# Patient Record
Sex: Female | Born: 1990 | Race: White | Hispanic: No | Marital: Married | Smoking: Never smoker
Health system: Southern US, Community
[De-identification: ages and names within clinical notes are randomized; demographics above are authoritative.]

## PROBLEM LIST (undated history)

## (undated) ENCOUNTER — Inpatient Hospital Stay (HOSPITAL_COMMUNITY): Payer: Self-pay

## (undated) ENCOUNTER — Ambulatory Visit: Admission: EM | Discharge status: Absent without leave | Payer: Self-pay

## (undated) DIAGNOSIS — I10 Essential (primary) hypertension: Secondary | ICD-10-CM

## (undated) DIAGNOSIS — M549 Dorsalgia, unspecified: Secondary | ICD-10-CM

## (undated) DIAGNOSIS — R569 Unspecified convulsions: Secondary | ICD-10-CM

## (undated) DIAGNOSIS — O159 Eclampsia, unspecified as to time period: Secondary | ICD-10-CM

## (undated) DIAGNOSIS — R87629 Unspecified abnormal cytological findings in specimens from vagina: Secondary | ICD-10-CM

## (undated) DIAGNOSIS — R51 Headache: Secondary | ICD-10-CM

## (undated) DIAGNOSIS — E119 Type 2 diabetes mellitus without complications: Secondary | ICD-10-CM

## (undated) HISTORY — DX: Essential (primary) hypertension: I10

## (undated) HISTORY — DX: Unspecified abnormal cytological findings in specimens from vagina: R87.629

## (undated) HISTORY — DX: Type 2 diabetes mellitus without complications: E11.9

---
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
## 2012-03-05 LAB — OB RESULTS CONSOLE RPR: RPR: NONREACTIVE

## 2012-03-05 LAB — OB RESULTS CONSOLE HIV ANTIBODY (ROUTINE TESTING): HIV: NONREACTIVE

## 2012-08-07 ENCOUNTER — Inpatient Hospital Stay (HOSPITAL_COMMUNITY): Payer: Medicaid Other

## 2012-08-07 ENCOUNTER — Encounter (HOSPITAL_COMMUNITY): Payer: Self-pay

## 2012-08-07 ENCOUNTER — Inpatient Hospital Stay (HOSPITAL_COMMUNITY)
Admission: AD | Admit: 2012-08-07 | Discharge: 2012-08-09 | DRG: 781 | Disposition: A | Discharge status: Home / Self Care | Payer: Medicaid Other | Source: Ambulatory Visit | Attending: Obstetrics and Gynecology | Admitting: Obstetrics and Gynecology

## 2012-08-07 DIAGNOSIS — O1490 Unspecified pre-eclampsia, unspecified trimester: Secondary | ICD-10-CM

## 2012-08-07 DIAGNOSIS — R079 Chest pain, unspecified: Secondary | ICD-10-CM | POA: Diagnosis present

## 2012-08-07 DIAGNOSIS — O133 Gestational [pregnancy-induced] hypertension without significant proteinuria, third trimester: Secondary | ICD-10-CM

## 2012-08-07 DIAGNOSIS — O99891 Other specified diseases and conditions complicating pregnancy: Secondary | ICD-10-CM | POA: Diagnosis present

## 2012-08-07 DIAGNOSIS — O139 Gestational [pregnancy-induced] hypertension without significant proteinuria, unspecified trimester: Principal | ICD-10-CM | POA: Diagnosis present

## 2012-08-07 DIAGNOSIS — R51 Headache: Secondary | ICD-10-CM | POA: Diagnosis present

## 2012-08-07 DIAGNOSIS — R0602 Shortness of breath: Secondary | ICD-10-CM | POA: Diagnosis present

## 2012-08-07 HISTORY — DX: Headache: R51

## 2012-08-07 LAB — COMPREHENSIVE METABOLIC PANEL
AST: 13 U/L (ref 0–37)
BUN: 8 mg/dL (ref 6–23)
Calcium: 9.1 mg/dL (ref 8.4–10.5)
Sodium: 135 mEq/L (ref 135–145)
Total Bilirubin: 0.2 mg/dL — ABNORMAL LOW (ref 0.3–1.2)
Total Protein: 5.8 g/dL — ABNORMAL LOW (ref 6.0–8.3)

## 2012-08-07 LAB — URINALYSIS, ROUTINE W REFLEX MICROSCOPIC
Bilirubin Urine: NEGATIVE
Hgb urine dipstick: NEGATIVE
Ketones, ur: NEGATIVE mg/dL
Leukocytes, UA: NEGATIVE
Specific Gravity, Urine: >1.03 — ABNORMAL HIGH (ref 1.005–1.030)
pH: 6 (ref 5.0–8.0)

## 2012-08-07 LAB — CBC
MCH: 31.3 pg (ref 26.0–34.0)
MCV: 92 fL (ref 78.0–100.0)
Platelets: 143 10*3/uL — ABNORMAL LOW (ref 150–400)
RBC: 3.61 MIL/uL — ABNORMAL LOW (ref 3.87–5.11)
RDW: 12.5 % (ref 11.5–15.5)

## 2012-08-07 LAB — TYPE AND SCREEN
ABO/RH(D): A POS
Antibody Screen: NEGATIVE

## 2012-08-07 LAB — ABO/RH: ABO/RH(D): A POS

## 2012-08-07 LAB — URINE MICROSCOPIC-ADD ON

## 2012-08-07 MED ORDER — DOCUSATE SODIUM 100 MG PO CAPS
100.0000 mg | ORAL_CAPSULE | Freq: Every day | ORAL | Status: DC
  Administered 2012-08-07 – 2012-08-09 (×2): 100 mg via ORAL
  Filled 2012-08-07 (×2): qty 1

## 2012-08-07 MED ORDER — ZOLPIDEM TARTRATE 5 MG PO TABS: 5.0000 mg | ORAL_TABLET | Freq: Every evening | ORAL | Status: DC | PRN

## 2012-08-07 MED ORDER — ACETAMINOPHEN 325 MG PO TABS: 650.0000 mg | ORAL_TABLET | ORAL | Status: DC | PRN

## 2012-08-07 MED ORDER — BETAMETHASONE SOD PHOS & ACET 6 (3-3) MG/ML IJ SUSP
12.0000 mg | INTRAMUSCULAR | Status: AC
  Administered 2012-08-07 – 2012-08-08 (×2): 12 mg via INTRAMUSCULAR
  Filled 2012-08-07 (×2): qty 2

## 2012-08-07 MED ORDER — CALCIUM CARBONATE ANTACID 500 MG PO CHEW
2.0000 | CHEWABLE_TABLET | ORAL | Status: DC | PRN
  Administered 2012-08-09: 400 mg via ORAL
  Filled 2012-08-07: qty 2

## 2012-08-07 MED ORDER — LACTATED RINGERS IV SOLN
INTRAVENOUS | Status: DC
  Administered 2012-08-07 – 2012-08-09 (×4): via INTRAVENOUS

## 2012-08-07 MED ORDER — PANTOPRAZOLE SODIUM 40 MG IV SOLR
40.0000 mg | Freq: Once | INTRAVENOUS | Status: AC
  Administered 2012-08-07: 40 mg via INTRAVENOUS
  Filled 2012-08-07: qty 40

## 2012-08-07 NOTE — H&P (Signed)
Christine Woodard is a 21 y.o. female presenting for elevated Blood Pressure  The patient was seen in the office today for her routine prenatal appointment.  She was c/o headaches for the past several weeks.  In the office she had a blood pressure of 120/80 and 1+ proteinuria.  She was sent to MAU for serial BPs and labs.  In MAU her BPs were 150s/90-100.  She did have 2 BPs that were 180/110, however she was found to be laying on the BP cuff when this occurred.  Pre-eclampsia labs were drawn and WNL. U/S for EFW Due to S>D.  EFW was ~37 weeks with an AFI 23. BPP 8/8. Given the elevated BPs the patient is admitted for BMZ, 24 hr urine and serial BPs.  The patients case was discussed with Dr. Sherrie George.  If she does begin to have sustained BPs in the severe range then she endorsed induction of labor History OB History    Grav Para Term Preterm Abortions TAB SAB Ect Mult Living   1 0 0 0 0 0 0 0 0 0      Past Medical History  Diagnosis Date  . Headache    History reviewed. No pertinent past surgical history. Family History: family history is not on file. Social History:  reports that she has never smoked. She does not have any smokeless tobacco history on file. She reports that she does not drink alcohol or use illicit drugs.   Prenatal Transfer Tool  Maternal Diabetes: No, elevated 1hr gtt, 3hr WNL Genetic Screening: Quad WNL Maternal Ultrasounds/Referrals: Normal Fetal Ultrasounds or other Referrals:  No Maternal Substance Abuse:  No Significant Maternal Medications:  No Significant Maternal Lab Results:  No Other Comments:  No  ROS: As above  Dilation: 1 Effacement (%): 70 Station: -2 Exam by:: dr Tenny Craw Blood pressure 153/94, pulse 66, temperature 98.6 F (37 C), temperature source Oral, resp. rate 20, height 5\' 6"  (1.676 m), weight 98.884 kg (218 lb), last menstrual period 09/04/2011. Exam Physical Exam  Prenatal labs: ABO, Rh: --/--/A POS (11/26 1614) Antibody: NEG (11/26  1614) Rubella:  Immune RPR:   NR HBsAg:   Neg HIV:   NR GBS:   Pending  Assessment/Plan: 1) Admit 2) Serial BPs, 24 hr urine, BMZ for enhancement of FLM    Kenslie Abbruzzese H. 08/07/2012, 5:48 PM

## 2012-08-07 NOTE — MAU Provider Note (Signed)
Chief Complaint:  Hypertension and Headache   First provider contact initiated 08/07/12 at 1246.  HPI: Christine Woodard is a 21 y.o. G1P0000 at [redacted]w[redacted]d who was sent to maternity admissions from office for Pre-eclampsia work-up. Reports HA and sternal pain x 2 weeks, none now. HA only occurs simultaneously with sternal pain. No relationship between pain and exertion. Also reports increase in pedal edema. Denies contractions, leakage of fluid, vaginal bleeding, vision changes, shortness of breath, cough. Good fetal movement.   Past Medical History: Past Medical History  Diagnosis Date  . Headache     Past obstetric history: OB History    Grav Para Term Preterm Abortions TAB SAB Ect Mult Living   1 0 0 0 0 0 0 0 0 0      # Outc Date GA Lbr Len/2nd Wgt Sex Del Anes PTL Lv   1 CUR               Past Surgical History: History reviewed. No pertinent past surgical history.  Family History: History reviewed. No pertinent family history.  Social History: History  Substance Use Topics  . Smoking status: Never Smoker   . Smokeless tobacco: Not on file  . Alcohol Use: No    Allergies: No Known Allergies  Meds:  Prescriptions prior to admission  Medication Sig Dispense Refill  . flintstones complete (FLINTSTONES) 60 MG chewable tablet Chew 1 tablet by mouth 2 (two) times daily.        ROS: Pertinent findings in history of present illness.  Physical Exam  Blood pressure 153/94, pulse 66, temperature 98.6 F (37 C), temperature source Oral, resp. rate 20, height 5\' 6"  (1.676 m), weight 98.884 kg (218 lb), last menstrual period 09/04/2011. GENERAL: Well-developed, well-nourished female in no acute distress.  HEENT: normocephalic HEART: normal rate RESP: normal effort ABDOMEN: Soft, non-tender, gravid appropriate for gestational age EXTREMITIES: Nontender, no edema NEURO: alert and oriented, deep tendon reflexes 3+, one beat clonus bilaterally SPECULUM EXAM: Deferred  FHT:   Baseline 120 , moderate variability, accelerations present, no decelerations Contractions: rare, mild   Labs: Results for orders placed during the hospital encounter of 08/07/12 (from the past 24 hour(s))  URINALYSIS, ROUTINE W REFLEX MICROSCOPIC     Status: Abnormal   Collection Time   08/07/12 12:10 PM      Component Value Range   Color, Urine YELLOW  YELLOW   APPearance HAZY (*) CLEAR   Specific Gravity, Urine >1.030 (*) 1.005 - 1.030   pH 6.0  5.0 - 8.0   Glucose, UA NEGATIVE  NEGATIVE mg/dL   Hgb urine dipstick NEGATIVE  NEGATIVE   Bilirubin Urine NEGATIVE  NEGATIVE   Ketones, ur NEGATIVE  NEGATIVE mg/dL   Protein, ur 161 (*) NEGATIVE mg/dL   Urobilinogen, UA 0.2  0.0 - 1.0 mg/dL   Nitrite NEGATIVE  NEGATIVE   Leukocytes, UA NEGATIVE  NEGATIVE  CBC     Status: Abnormal   Collection Time   08/07/12 12:10 PM      Component Value Range   WBC 10.0  4.0 - 10.5 K/uL   RBC 3.61 (*) 3.87 - 5.11 MIL/uL   Hemoglobin 11.3 (*) 12.0 - 15.0 g/dL   HCT 09.6 (*) 04.5 - 40.9 %   MCV 92.0  78.0 - 100.0 fL   MCH 31.3  26.0 - 34.0 pg   MCHC 34.0  30.0 - 36.0 g/dL   RDW 81.1  91.4 - 78.2 %   Platelets 143 (*) 150 -  400 K/uL  COMPREHENSIVE METABOLIC PANEL     Status: Abnormal   Collection Time   08/07/12 12:10 PM      Component Value Range   Sodium 135  135 - 145 mEq/L   Potassium 4.0  3.5 - 5.1 mEq/L   Chloride 103  96 - 112 mEq/L   CO2 22  19 - 32 mEq/L   Glucose, Bld 78  70 - 99 mg/dL   BUN 8  6 - 23 mg/dL   Creatinine, Ser 0.86  0.50 - 1.10 mg/dL   Calcium 9.1  8.4 - 57.8 mg/dL   Total Protein 5.8 (*) 6.0 - 8.3 g/dL   Albumin 2.6 (*) 3.5 - 5.2 g/dL   AST 13  0 - 37 U/L   ALT 6  0 - 35 U/L   Alkaline Phosphatase 113  39 - 117 U/L   Total Bilirubin 0.2 (*) 0.3 - 1.2 mg/dL   GFR calc non Af Amer >90  >90 mL/min   GFR calc Af Amer >90  >90 mL/min  LACTATE DEHYDROGENASE     Status: Abnormal   Collection Time   08/07/12 12:10 PM      Component Value Range   LDH 254 (*) 94 - 250  U/L  URIC ACID     Status: Normal   Collection Time   08/07/12 12:10 PM      Component Value Range   Uric Acid, Serum 4.3  2.4 - 7.0 mg/dL  URINE MICROSCOPIC-ADD ON     Status: Abnormal   Collection Time   08/07/12 12:10 PM      Component Value Range   Squamous Epithelial / LPF MANY (*) RARE   WBC, UA 3-6  <3 WBC/hpf   RBC / HPF 0-2  <3 RBC/hpf   Bacteria, UA MANY (*) RARE   Crystals CA OXALATE CRYSTALS (*) NEGATIVE   Urine-Other MUCOUS PRESENT    TYPE AND SCREEN     Status: Normal   Collection Time   08/07/12  4:14 PM      Component Value Range   ABO/RH(D) A POS     Antibody Screen NEG     Sample Expiration 08/10/2012    ABO/RH     Status: Normal   Collection Time   08/07/12  4:14 PM      Component Value Range   ABO/RH(D) A POS      Imaging:  [place]: EFW 2792 gm/6 lb 2 oz (88%) AFI 23.41 (89%) BPP 8/8  MAU Course:   Assessment: 1. Proteinuric hypertension of pregnancy     Plan: Admit for 24 hour urine, BMZ and BP monitoring per Dr. Tenny Craw.   [place], CNM 08/07/2012 7:11 PM

## 2012-08-07 NOTE — MAU Note (Signed)
Patient is sent from the office due to elevated BP and headache. She denies visual disturbance. Reports good fetal movement.

## 2012-08-08 ENCOUNTER — Inpatient Hospital Stay (HOSPITAL_COMMUNITY): Payer: Medicaid Other

## 2012-08-08 LAB — PROTEIN, URINE, 24 HOUR
Protein, 24H Urine: 285 mg/d — ABNORMAL HIGH (ref 50–100)
Protein, Urine: 57 mg/dL
Urine Total Volume-UPROT: 500 mL

## 2012-08-08 LAB — CREATININE CLEARANCE, URINE, 24 HOUR
Creatinine: 0.51 mg/dL (ref 0.50–1.10)
Urine Total Volume-CRCL: 500 mL

## 2012-08-08 LAB — URINE CULTURE: Colony Count: 2000

## 2012-08-08 MED ORDER — FAMOTIDINE 20 MG PO TABS: 20.0000 mg | ORAL_TABLET | Freq: Two times a day (BID) | ORAL | Status: DC

## 2012-08-08 NOTE — Progress Notes (Addendum)
21 y.o. G1P0000 [redacted]w[redacted]d HD#1 admitted for PIH.  Pt currently stable with no c/o - SOB/CP is NOT better despite SaO2 are 98% on RA.    Chest pain is across top of lungs both sides.  SOB worsens with chest pain.   HA come with chest pain as well.  Good FM.  Filed Vitals:   08/07/12 2030 08/07/12 2100 08/07/12 2300 08/08/12 0329  BP:    128/73  Pulse:   79 72  Temp:    98.5 F (36.9 C)  TempSrc:    Oral  Resp: 18 18 18 16   Height:      Weight:      SpO2:   98%    Lungs CTA Cor RRR Abd  Soft, gravid, nontender Ex SCDs FHTs  120s, good short term variability, NST R Toco  occ  Results for orders placed during the hospital encounter of 08/07/12 (from the past 24 hour(s))  URINALYSIS, ROUTINE W REFLEX MICROSCOPIC     Status: Abnormal   Collection Time   08/07/12 12:10 PM      Component Value Range   Color, Urine YELLOW  YELLOW   APPearance HAZY (*) CLEAR   Specific Gravity, Urine >1.030 (*) 1.005 - 1.030   pH 6.0  5.0 - 8.0   Glucose, UA NEGATIVE  NEGATIVE mg/dL   Hgb urine dipstick NEGATIVE  NEGATIVE   Bilirubin Urine NEGATIVE  NEGATIVE   Ketones, ur NEGATIVE  NEGATIVE mg/dL   Protein, ur 664 (*) NEGATIVE mg/dL   Urobilinogen, UA 0.2  0.0 - 1.0 mg/dL   Nitrite NEGATIVE  NEGATIVE   Leukocytes, UA NEGATIVE  NEGATIVE  CBC     Status: Abnormal   Collection Time   08/07/12 12:10 PM      Component Value Range   WBC 10.0  4.0 - 10.5 K/uL   RBC 3.61 (*) 3.87 - 5.11 MIL/uL   Hemoglobin 11.3 (*) 12.0 - 15.0 g/dL   HCT 40.3 (*) 47.4 - 25.9 %   MCV 92.0  78.0 - 100.0 fL   MCH 31.3  26.0 - 34.0 pg   MCHC 34.0  30.0 - 36.0 g/dL   RDW 56.3  87.5 - 64.3 %   Platelets 143 (*) 150 - 400 K/uL  COMPREHENSIVE METABOLIC PANEL     Status: Abnormal   Collection Time   08/07/12 12:10 PM      Component Value Range   Sodium 135  135 - 145 mEq/L   Potassium 4.0  3.5 - 5.1 mEq/L   Chloride 103  96 - 112 mEq/L   CO2 22  19 - 32 mEq/L   Glucose, Bld 78  70 - 99 mg/dL   BUN 8  6 - 23 mg/dL   Creatinine, Ser 3.29  0.50 - 1.10 mg/dL   Calcium 9.1  8.4 - 51.8 mg/dL   Total Protein 5.8 (*) 6.0 - 8.3 g/dL   Albumin 2.6 (*) 3.5 - 5.2 g/dL   AST 13  0 - 37 U/L   ALT 6  0 - 35 U/L   Alkaline Phosphatase 113  39 - 117 U/L   Total Bilirubin 0.2 (*) 0.3 - 1.2 mg/dL   GFR calc non Af Amer >90  >90 mL/min   GFR calc Af Amer >90  >90 mL/min  LACTATE DEHYDROGENASE     Status: Abnormal   Collection Time   08/07/12 12:10 PM      Component Value Range   LDH 254 (*) 94 -  250 U/L  URIC ACID     Status: Normal   Collection Time   08/07/12 12:10 PM      Component Value Range   Uric Acid, Serum 4.3  2.4 - 7.0 mg/dL  URINE MICROSCOPIC-ADD ON     Status: Abnormal   Collection Time   08/07/12 12:10 PM      Component Value Range   Squamous Epithelial / LPF MANY (*) RARE   WBC, UA 3-6  <3 WBC/hpf   RBC / HPF 0-2  <3 RBC/hpf   Bacteria, UA MANY (*) RARE   Crystals CA OXALATE CRYSTALS (*) NEGATIVE   Urine-Other MUCOUS PRESENT    TYPE AND SCREEN     Status: Normal   Collection Time   08/07/12  4:14 PM      Component Value Range   ABO/RH(D) A POS     Antibody Screen NEG     Sample Expiration 08/10/2012    ABO/RH     Status: Normal   Collection Time   08/07/12  4:14 PM      Component Value Range   ABO/RH(D) A POS     U/S  Vtx, EFW 6#2, AFI 23, BPP 8/8 yesterday.  A:  HD#1  [redacted]w[redacted]d with Mild Preeclampsia.  BPs were initially 150s/100s but now are in normal range.  She had 3+ protein on dip.  P: 1.  Pt was 33 6/7 weeks yesterday and received BMZ.  Second dose due today.   2.  24 hour urine is in progress.  3.  Repeat labs in am unless 24 hour urine is >3 gm protein. 4.  Although it is likely that chest pain/SOB is related to anxiety, pt continues to c/o about it and I feel we need to at least do a CXR with shielding.  Gilmar Bua A

## 2012-08-08 NOTE — Progress Notes (Signed)
08/08/12 1300  Clinical Encounter Type  Visited With Patient and family together  Visit Type Initial;Spiritual support;Social support  Spiritual Encounters  Spiritual Needs Emotional    Made initial visit to introduce chaplain services and availability.  Christine Woodard was calm and hopeful that lab results will allow her to go home, but also appreciative to know of extra support if desired during this or a subsequent stay.  Provided pastoral presence and encouragement.  [address] [place], [place] [phone]

## 2012-08-09 MED ORDER — ZOLPIDEM TARTRATE 5 MG PO TABS: 5.0000 mg | ORAL_TABLET | Freq: Every evening | ORAL | Status: DC | PRN

## 2012-08-09 MED ORDER — DOCUSATE SODIUM 100 MG PO CAPS: 100.0000 mg | ORAL_CAPSULE | Freq: Two times a day (BID) | ORAL | Status: DC

## 2012-08-09 MED ORDER — OMEPRAZOLE 40 MG PO CPDR: 40.0000 mg | DELAYED_RELEASE_CAPSULE | Freq: Every day | ORAL | Status: DC

## 2012-08-09 NOTE — Progress Notes (Signed)
Discharged home via wheelchair.

## 2012-08-09 NOTE — Discharge Summary (Signed)
Obstetric Discharge Summary Reason for Admission: Elevated Blood Pressures Prenatal Procedures: NST, ultrasound and 24 hr urine for protein, Betamethasone x 2 11/26 & 11/27 Intrapartum Procedures: NA Postpartum Procedures: NA Complications-Operative and Postpartum: NA Hemoglobin  Date Value Range Status  08/07/2012 11.3* 12.0 - 15.0 g/dL Final     HCT  Date Value Range Status  08/07/2012 33.2* 36.0 - 46.0 % Final    Physical Exam:  General: alert, cooperative and appears stated age Gravid soft NT FHT Reactive NST  Discharge Diagnoses: Preterm pregnancy, gestational hypertension. 24hr urine protein 285 mg  Discharge Information: Date: 08/09/2012 Activity: bedrest Diet: routine, increase fluid intake with water Medications: Ambien, Prilosec, Colace Condition: stable Instructions: refer to practice specific booklet and F/U in office Tuesday for a BP check Discharge to: home Follow-up Information    Follow up with Almon Hercules., MD. In 5 days. (Call office monday for an appointment Tuesday )    Contact information:   [address] [place] [address] [phone]          Almon Hercules. 08/09/2012, 12:17 PM

## 2012-08-09 NOTE — Progress Notes (Signed)
Pt states my chest hurts. Feels like my food is trying to come up. Assessed further. Tums given po. Informed pt on heartburn/indigestion. Receptive.

## 2012-08-10 ENCOUNTER — Encounter (HOSPITAL_COMMUNITY): Payer: Self-pay

## 2012-08-10 ENCOUNTER — Inpatient Hospital Stay (HOSPITAL_COMMUNITY)
Admission: AD | Admit: 2012-08-10 | Discharge: 2012-08-13 | DRG: 774 | Disposition: A | Discharge status: Home / Self Care | Payer: Medicaid Other | Source: Ambulatory Visit | Attending: Obstetrics and Gynecology | Admitting: Obstetrics and Gynecology

## 2012-08-10 ENCOUNTER — Inpatient Hospital Stay (HOSPITAL_COMMUNITY): Payer: Medicaid Other | Admitting: Anesthesiology

## 2012-08-10 ENCOUNTER — Encounter (HOSPITAL_COMMUNITY): Payer: Self-pay | Admitting: Anesthesiology

## 2012-08-10 DIAGNOSIS — O151 Eclampsia in labor: Secondary | ICD-10-CM | POA: Diagnosis present

## 2012-08-10 DIAGNOSIS — O409XX0 Polyhydramnios, unspecified trimester, not applicable or unspecified: Secondary | ICD-10-CM | POA: Diagnosis present

## 2012-08-10 LAB — COMPREHENSIVE METABOLIC PANEL
AST: 15 U/L (ref 0–37)
Albumin: 2.6 g/dL — ABNORMAL LOW (ref 3.5–5.2)
Alkaline Phosphatase: 97 U/L (ref 39–117)
BUN: 7 mg/dL (ref 6–23)
Chloride: 104 mEq/L (ref 96–112)
Creatinine, Ser: 0.46 mg/dL — ABNORMAL LOW (ref 0.50–1.10)
Potassium: 3.6 mEq/L (ref 3.5–5.1)
Total Bilirubin: 0.2 mg/dL — ABNORMAL LOW (ref 0.3–1.2)
Total Protein: 5.5 g/dL — ABNORMAL LOW (ref 6.0–8.3)

## 2012-08-10 LAB — CBC
HCT: 32.9 % — ABNORMAL LOW (ref 36.0–46.0)
HCT: 32.5 % — ABNORMAL LOW (ref 36.0–46.0)
MCH: 31.3 pg (ref 26.0–34.0)
MCHC: 33.4 g/dL (ref 30.0–36.0)
MCHC: 33.8 g/dL (ref 30.0–36.0)
Platelets: 140 10*3/uL — ABNORMAL LOW (ref 150–400)
RDW: 12.6 % (ref 11.5–15.5)
RDW: 12.7 % (ref 11.5–15.5)
WBC: 13.8 10*3/uL — ABNORMAL HIGH (ref 4.0–10.5)

## 2012-08-10 LAB — URINALYSIS, ROUTINE W REFLEX MICROSCOPIC
Nitrite: NEGATIVE
Specific Gravity, Urine: 1.025 (ref 1.005–1.030)
Urobilinogen, UA: 0.2 mg/dL (ref 0.0–1.0)
pH: 7 (ref 5.0–8.0)

## 2012-08-10 LAB — CULTURE, BETA STREP (GROUP B ONLY)

## 2012-08-10 LAB — RPR: RPR Ser Ql: NONREACTIVE

## 2012-08-10 LAB — URIC ACID: Uric Acid, Serum: 4 mg/dL (ref 2.4–7.0)

## 2012-08-10 LAB — URINE MICROSCOPIC-ADD ON

## 2012-08-10 LAB — LACTATE DEHYDROGENASE: LDH: 230 U/L (ref 94–250)

## 2012-08-10 MED ORDER — PENICILLIN G POTASSIUM 5000000 UNITS IJ SOLR
2.5000 10*6.[IU] | INTRAVENOUS | Status: DC
  Filled 2012-08-10 (×3): qty 2.5

## 2012-08-10 MED ORDER — IBUPROFEN 600 MG PO TABS: 600.0000 mg | ORAL_TABLET | Freq: Four times a day (QID) | ORAL | Status: DC | PRN

## 2012-08-10 MED ORDER — ACETAMINOPHEN 160 MG/5ML PO SOLN
650.0000 mg | ORAL | Status: DC | PRN
  Filled 2012-08-10: qty 20.3

## 2012-08-10 MED ORDER — PENICILLIN G POTASSIUM 5000000 UNITS IJ SOLR
5.0000 10*6.[IU] | Freq: Once | INTRAVENOUS | Status: AC
  Administered 2012-08-10: 5 10*6.[IU] via INTRAVENOUS
  Filled 2012-08-10: qty 5

## 2012-08-10 MED ORDER — TERBUTALINE SULFATE 1 MG/ML IJ SOLN: 0.2500 mg | Freq: Once | INTRAMUSCULAR | Status: AC | PRN

## 2012-08-10 MED ORDER — LACTATED RINGERS IV SOLN
INTRAVENOUS | Status: DC
  Administered 2012-08-10: 250 mL via INTRAVENOUS
  Administered 2012-08-10: 10:00:00 via INTRAVENOUS

## 2012-08-10 MED ORDER — LACTATED RINGERS IV SOLN: INTRAVENOUS | Status: DC

## 2012-08-10 MED ORDER — LACTATED RINGERS IV SOLN
500.0000 mL | INTRAVENOUS | Status: DC | PRN
  Administered 2012-08-10: 50 mL via INTRAVENOUS

## 2012-08-10 MED ORDER — MAGNESIUM SULFATE 40 G IN LACTATED RINGERS - SIMPLE
2.0000 g/h | INTRAVENOUS | Status: DC
  Administered 2012-08-11 – 2012-08-12 (×2): 2 g/h via INTRAVENOUS
  Filled 2012-08-10 (×3): qty 500

## 2012-08-10 MED ORDER — LACTATED RINGERS IV SOLN: 500.0000 mL | Freq: Once | INTRAVENOUS | Status: DC

## 2012-08-10 MED ORDER — PHENYLEPHRINE 40 MCG/ML (10ML) SYRINGE FOR IV PUSH (FOR BLOOD PRESSURE SUPPORT)
80.0000 ug | PREFILLED_SYRINGE | INTRAVENOUS | Status: DC | PRN
  Filled 2012-08-10: qty 5

## 2012-08-10 MED ORDER — EPHEDRINE 5 MG/ML INJ: 10.0000 mg | INTRAVENOUS | Status: DC | PRN

## 2012-08-10 MED ORDER — ONDANSETRON HCL 4 MG/2ML IJ SOLN: 4.0000 mg | Freq: Four times a day (QID) | INTRAMUSCULAR | Status: DC | PRN

## 2012-08-10 MED ORDER — ONDANSETRON HCL 4 MG/2ML IJ SOLN
4.0000 mg | Freq: Once | INTRAMUSCULAR | Status: AC
  Administered 2012-08-10: 4 mg via INTRAVENOUS
  Filled 2012-08-10: qty 2

## 2012-08-10 MED ORDER — LIDOCAINE HCL (PF) 1 % IJ SOLN
30.0000 mL | INTRAMUSCULAR | Status: DC | PRN
  Filled 2012-08-10: qty 30

## 2012-08-10 MED ORDER — PHENYLEPHRINE 40 MCG/ML (10ML) SYRINGE FOR IV PUSH (FOR BLOOD PRESSURE SUPPORT): 80.0000 ug | PREFILLED_SYRINGE | INTRAVENOUS | Status: DC | PRN

## 2012-08-10 MED ORDER — OXYTOCIN 40 UNITS IN LACTATED RINGERS INFUSION - SIMPLE MED: 62.5000 mL/h | INTRAVENOUS | Status: DC

## 2012-08-10 MED ORDER — OXYTOCIN BOLUS FROM INFUSION
500.0000 mL | INTRAVENOUS | Status: DC
  Administered 2012-08-11: 500 mL via INTRAVENOUS

## 2012-08-10 MED ORDER — MAGNESIUM SULFATE 50 % IJ SOLN
6.0000 g | Freq: Once | INTRAVENOUS | Status: AC
  Administered 2012-08-10: 6 g via INTRAVENOUS

## 2012-08-10 MED ORDER — DIAZEPAM 5 MG/ML IJ SOLN
10.0000 mg | Freq: Once | INTRAMUSCULAR | Status: AC
  Administered 2012-08-10: 10 mg via INTRAVENOUS

## 2012-08-10 MED ORDER — OXYCODONE-ACETAMINOPHEN 5-325 MG PO TABS: 1.0000 | ORAL_TABLET | ORAL | Status: DC | PRN

## 2012-08-10 MED ORDER — CITRIC ACID-SODIUM CITRATE 334-500 MG/5ML PO SOLN: 30.0000 mL | ORAL | Status: DC | PRN

## 2012-08-10 MED ORDER — DIPHENHYDRAMINE HCL 50 MG/ML IJ SOLN
12.5000 mg | INTRAMUSCULAR | Status: DC | PRN
Start: 2012-08-10 — End: 2012-08-11

## 2012-08-10 MED ORDER — EPHEDRINE 5 MG/ML INJ
10.0000 mg | INTRAVENOUS | Status: DC | PRN
  Filled 2012-08-10: qty 4

## 2012-08-10 MED ORDER — ACETAMINOPHEN 325 MG PO TABS: 650.0000 mg | ORAL_TABLET | ORAL | Status: DC | PRN

## 2012-08-10 MED ORDER — LIDOCAINE HCL (PF) 1 % IJ SOLN
INTRAMUSCULAR | Status: DC | PRN
  Administered 2012-08-10 (×2): 5 mL

## 2012-08-10 MED ORDER — ACETAMINOPHEN 325 MG PO TABS
650.0000 mg | ORAL_TABLET | Freq: Once | ORAL | Status: DC
  Filled 2012-08-10: qty 2

## 2012-08-10 MED ORDER — OXYTOCIN 40 UNITS IN LACTATED RINGERS INFUSION - SIMPLE MED
1.0000 m[IU]/min | INTRAVENOUS | Status: DC
  Administered 2012-08-10: 2 m[IU]/min via INTRAVENOUS
  Administered 2012-08-10: 22 m[IU]/min via INTRAVENOUS
  Filled 2012-08-10: qty 1000

## 2012-08-10 MED ORDER — FENTANYL 2.5 MCG/ML BUPIVACAINE 1/10 % EPIDURAL INFUSION (WH - ANES)
14.0000 mL/h | INTRAMUSCULAR | Status: DC
  Administered 2012-08-10: 14 mL/h via EPIDURAL
  Filled 2012-08-10: qty 125

## 2012-08-10 NOTE — Progress Notes (Signed)
2nd IV site that was started in MAU on her right hand, noted to be swollen and IV removed.

## 2012-08-10 NOTE — MAU Note (Signed)
Patient is in with c/o n/v and headache this morning. She denies diarrhea. She states that she was discharged from antenatal unit yesterday for gestational hypertension evaluation. She states that her BP have been elevated but was not able to pick up her prescriptions. She denies contractions, vaginal bleeding, or lof. She reports good fetal movement.

## 2012-08-10 NOTE — Progress Notes (Addendum)
At 12:01pm, patient's boyfriend called out that Patient was on the floor. I walked in to find patient on the floor beside her bed. She was on her right side. Thressa Sheller, cnm at bedside. She was alert, oriented to her name and place. She could not recall how she ended up on the floor. With assistance from another staff, we got back to stretcher. She had reddened bruise to both knees, and both anterior legs. No skin tear or bleeding observed. There were no bruising on her abdomen or head. Patient reports still having a headache but denies any other pain or discomfort. She reports good fetal movement.  At 12:04pm, patient had a seizure, her lips went blue. Patient is flat on stretcher, blankets placed on sides of stretcher. nrb mask applied with oxygen 10l, pulse oximetry is 99%, Heather Hogan cnm and rapid response notified.  At 12:05, rapid response team arrived, including Rinaldo Cloud, rn (from l/d unit).  At 12:08, 6grams magnesium bolus started.  At 12:09, Dr Dareen Piano at bedside. At 12:12, 10mg  valium administered.

## 2012-08-10 NOTE — H&P (Signed)
Pt is an 21 year old white female, G1P0 at 44 1/2 weeks who is admitted to L&D after having two seizures in the ER. Pt was admitted 11/26-11/28 for evaluation of hypertension. She was sent home yesterday after she had normal B/Ps, normal Labs, and only 285mg  in her 24 hour urine. She called this am stating that she had been having N/V and a headache since midnight. She was told to come to the ER. While being evaluated in the ER she had two seizures. She was given MgSO4 and Valium and transferred to L&D.  She is now alert and oriented. She appears to have no neurological defects. Pt was noted to have Polyhydramnios on a recent ultrasound. PMHx: see [place] PE: 165/96        HEENT-wnl        Abd- gravid, non tender, no RUQ pain.        Exts- 1+ clonus on admission,         Cx-70/1-2/-3 vtx          FHTs-reactive, no decels IMP/ IUP at 34 weeks with Eclampsia PLAN/ Admit            Continue MgSO4            Start Induction             Start PCN

## 2012-08-10 NOTE — Anesthesia Procedure Notes (Signed)

## 2012-08-10 NOTE — Progress Notes (Signed)
Heather hogan notified that patient did not want to take the tylenol.

## 2012-08-10 NOTE — Anesthesia Preprocedure Evaluation (Addendum)
Anesthesia Evaluation  Patient identified by MRN, date of birth, ID band Patient awake    Reviewed: Allergy & Precautions, H&P , Patient's Chart, lab work & pertinent test results  Airway Mallampati: II TM Distance: >3 FB Neck ROM: full    Dental No notable dental hx.    Pulmonary neg pulmonary ROS,  breath sounds clear to auscultation  Pulmonary exam normal       Cardiovascular negative cardio ROS  Rhythm:regular Rate:Normal     Neuro/Psych  Headaches, negative neurological ROS  negative psych ROS   GI/Hepatic negative GI ROS, Neg liver ROS,   Endo/Other  negative endocrine ROSMorbid obesity  Renal/GU negative Renal ROS     Musculoskeletal   Abdominal   Peds  Hematology negative hematology ROS (+)   Anesthesia Other Findings eclampsia  Reproductive/Obstetrics (+) Pregnancy                          Anesthesia Physical Anesthesia Plan  ASA: IV  Anesthesia Plan: Epidural   Post-op Pain Management:    Induction:   Airway Management Planned:   Additional Equipment:   Intra-op Plan:   Post-operative Plan:   Informed Consent: I have reviewed the patients History and Physical, chart, labs and discussed the procedure including the risks, benefits and alternatives for the proposed anesthesia with the patient or authorized representative who has indicated his/her understanding and acceptance.     Plan Discussed with:   Anesthesia Plan Comments:         Anesthesia Quick Evaluation

## 2012-08-10 NOTE — MAU Provider Note (Signed)
  History     CSN: [id number]  Arrival date and time: 08/10/12 7829   First Provider Initiated Contact with Patient 08/10/12 0933      Chief Complaint  Patient presents with  . Nausea  . Emesis  . Headache   HPI  Christine Woodard is a 21 y.o. G1P0000 at [redacted]w[redacted]d she presents today with nausea and vomiting since 11:00 last night. She has also had a constant headache. She has had a headache since being here in antenatal, but it has gotten much worse.   Past Medical History  Diagnosis Date  . Headache     History reviewed. No pertinent past surgical history.  History reviewed. No pertinent family history.  History  Substance Use Topics  . Smoking status: Never Smoker   . Smokeless tobacco: Not on file  . Alcohol Use: No    Allergies: No Known Allergies  Prescriptions prior to admission  Medication Sig Dispense Refill  . docusate sodium (COLACE) 100 MG capsule Take 1 capsule (100 mg total) by mouth 2 (two) times daily.  60 capsule  0  . flintstones complete (FLINTSTONES) 60 MG chewable tablet Chew 1 tablet by mouth 2 (two) times daily.      Marland Kitchen omeprazole (PRILOSEC) 40 MG capsule Take 1 capsule (40 mg total) by mouth daily.  30 capsule  1  . zolpidem (AMBIEN) 5 MG tablet Take 1 tablet (5 mg total) by mouth at bedtime as needed for sleep.  30 tablet  0    Review of Systems  Constitutional: Negative for fever.  Eyes: Positive for blurred vision.  Respiratory: Positive for shortness of breath.   Gastrointestinal: Positive for heartburn, nausea, vomiting and abdominal pain. Negative for diarrhea and constipation.  Genitourinary: Negative for dysuria, urgency and frequency.  Neurological: Positive for dizziness and headaches. Negative for tingling.   Physical Exam   Blood pressure 172/107, pulse 65, temperature 97 F (36.1 C), temperature source Oral, resp. rate 18, last menstrual period 09/04/2011.  Physical Exam  Nursing note and vitals reviewed. Constitutional: She is  oriented to person, place, and time. She appears well-developed and well-nourished.  Cardiovascular: Normal rate.   Respiratory: Effort normal.  GI: Soft. She exhibits no distension. There is no tenderness.  Neurological: She is alert and oriented to person, place, and time. She displays abnormal reflex (reflexes 2+, 1 beat of clonus).    MAU Course  Procedures  1102: Dr. Dareen Piano notified of patient status. He will think about it, and let us know what the plan is. 1150 Talked with Dr. Dareen Piano. Reviewed most recent blood pressures with him, and he said he is on his way to see the patient. No orders received at that time.  1200: Pt found on floor by RN. Husband had left the room to eat lunch. Pt oriented to person, place and time but with slurred speech. Dr. Dareen Piano notified again. CNM expressed concern that this was likely a seizure. Asked if we could start Mag now. Order given to start Mag 1204: Pt actively seizing. Dr. Dareen Piano called again. Give 10mg  Valium now. 1209: Dr. Dareen Piano here and assumed care of the patient.    Assessment and Plan  Eclampsia Admit to labor and delivery Dr. Dareen Piano here and assumed care.   Tawnya Crook 08/10/2012, 9:34 AM

## 2012-08-11 ENCOUNTER — Encounter (HOSPITAL_COMMUNITY): Payer: Self-pay | Admitting: *Deleted

## 2012-08-11 LAB — CBC
MCH: 31.3 pg (ref 26.0–34.0)
MCHC: 34 g/dL (ref 30.0–36.0)
MCV: 93.2 fL (ref 78.0–100.0)
Platelets: 150 10*3/uL (ref 150–400)
Platelets: 141 10*3/uL — ABNORMAL LOW (ref 150–400)
RDW: 12.6 % (ref 11.5–15.5)
RDW: 12.7 % (ref 11.5–15.5)
WBC: 17.1 10*3/uL — ABNORMAL HIGH (ref 4.0–10.5)
WBC: 16.1 10*3/uL — ABNORMAL HIGH (ref 4.0–10.5)

## 2012-08-11 LAB — COMPREHENSIVE METABOLIC PANEL
AST: 21 U/L (ref 0–37)
Albumin: 1.9 g/dL — ABNORMAL LOW (ref 3.5–5.2)
Alkaline Phosphatase: 77 U/L (ref 39–117)
Calcium: 6.4 mg/dL — CL (ref 8.4–10.5)
Creatinine, Ser: 0.49 mg/dL — ABNORMAL LOW (ref 0.50–1.10)
GFR calc Af Amer: >90 mL/min (ref >90)
GFR calc non Af Amer: >90 mL/min (ref >90)
Sodium: 134 mEq/L — ABNORMAL LOW (ref 135–145)
Total Bilirubin: 0.2 mg/dL — ABNORMAL LOW (ref 0.3–1.2)

## 2012-08-11 LAB — MRSA PCR SCREENING: MRSA by PCR: NEGATIVE

## 2012-08-11 MED ORDER — INFLUENZA VIRUS VACC SPLIT PF IM SUSP
0.5000 mL | INTRAMUSCULAR | Status: DC
  Filled 2012-08-11: qty 0.5

## 2012-08-11 MED ORDER — CALCIUM CARBONATE ANTACID 500 MG PO CHEW
1.0000 | CHEWABLE_TABLET | Freq: Three times a day (TID) | ORAL | Status: DC
  Administered 2012-08-11 – 2012-08-13 (×6): 200 mg via ORAL
  Filled 2012-08-11 (×6): qty 1

## 2012-08-11 MED ORDER — FUROSEMIDE 10 MG/ML IJ SOLN
20.0000 mg | Freq: Once | INTRAMUSCULAR | Status: AC
  Administered 2012-08-11: 20 mg via INTRAVENOUS
  Filled 2012-08-11: qty 2

## 2012-08-11 MED ORDER — OXYCODONE-ACETAMINOPHEN 5-325 MG PO TABS: 1.0000 | ORAL_TABLET | ORAL | Status: DC | PRN

## 2012-08-11 MED ORDER — LACTATED RINGERS IV SOLN
INTRAVENOUS | Status: DC
  Administered 2012-08-11 – 2012-08-12 (×2): via INTRAVENOUS

## 2012-08-11 MED ORDER — SODIUM CHLORIDE 0.9 % IV SOLN: 250.0000 mL | INTRAVENOUS | Status: DC | PRN

## 2012-08-11 MED ORDER — BENZOCAINE-MENTHOL 20-0.5 % EX AERO
1.0000 "application " | INHALATION_SPRAY | Freq: Four times a day (QID) | CUTANEOUS | Status: DC | PRN
  Filled 2012-08-11: qty 56

## 2012-08-11 NOTE — Progress Notes (Signed)
Dr. Dareen Piano made aware of continued low U/O-see new orders

## 2012-08-11 NOTE — Progress Notes (Signed)
Dr. Dareen Piano in on rounds-made aware of low urine output, lab results (including calcium of 6.4) and recent VS's. Pt remains groggy but  arousable  and oriented X 4.  Will closely monitor U/O  and report continued oliguria to MD.  POC discussed with pt-verbalized understanding.

## 2012-08-11 NOTE — Progress Notes (Signed)
PPD#1 Pt without complaints. No headache today. Oriented x 4 B/Ps wnl this am. Urine output low over the last 6 hours. I/Os neg 1,000 Labs ok except low calcium IMP/ stable PLAN/ Will follow urine output             Continue MgSO4

## 2012-08-11 NOTE — Progress Notes (Addendum)
Dr. Dareen Piano given update on U/O following Lasix IV-no new orders received-will continue to monitor closely. Ok to d/c telemetry and take pt to NICU for a visit.

## 2012-08-12 LAB — CBC
Platelets: 159 10*3/uL (ref 150–400)
RBC: 2.4 MIL/uL — ABNORMAL LOW (ref 3.87–5.11)
RDW: 12.7 % (ref 11.5–15.5)
WBC: 13.2 10*3/uL — ABNORMAL HIGH (ref 4.0–10.5)

## 2012-08-12 LAB — COMPREHENSIVE METABOLIC PANEL
ALT: 9 U/L (ref 0–35)
AST: 10 U/L (ref 0–37)
Albumin: 1.8 g/dL — ABNORMAL LOW (ref 3.5–5.2)
CO2: 28 mEq/L (ref 19–32)
Chloride: 103 mEq/L (ref 96–112)
GFR calc non Af Amer: >90 mL/min (ref >90)
Sodium: 136 mEq/L (ref 135–145)
Total Bilirubin: 0.1 mg/dL — ABNORMAL LOW (ref 0.3–1.2)

## 2012-08-12 LAB — URIC ACID: Uric Acid, Serum: 5.2 mg/dL (ref 2.4–7.0)

## 2012-08-12 LAB — LACTATE DEHYDROGENASE: LDH: 246 U/L (ref 94–250)

## 2012-08-12 NOTE — Progress Notes (Signed)
Assisted pt up to bathroom for 1st void since foley was d/c'd. Pt tolerated well, but did report some continued generalized weakness. Voided 450cc and passed a softball-sized clot. Lochia small to scant. Pt without complaints.

## 2012-08-12 NOTE — Progress Notes (Signed)
D/T pt's allergy to Latex, flu vaccine d/c'd and pt instructed to get vaccine as out pt.- Pharmacist stated that our (inpt) vaccine's are not latex free. Pt verbalized understanding.

## 2012-08-12 NOTE — Progress Notes (Signed)
Pt's only complaint is fatigue. No headache or vision changes. B/Ps are normal. Labs this am are normal. Urine output has improved this am. IMP/ Improving Plan/ Will remove cath          Will stop MgSO4           Will probably transfer out of ICU today.

## 2012-08-13 MED ORDER — HYDROCODONE-ACETAMINOPHEN 10-325 MG PO TABS: 1.0000 | ORAL_TABLET | ORAL | Status: DC | PRN

## 2012-08-13 MED ORDER — HYDROCODONE-ACETAMINOPHEN 5-500 MG PO TABS: 1.0000 | ORAL_TABLET | Freq: Four times a day (QID) | ORAL | Status: DC | PRN

## 2012-08-13 NOTE — Discharge Summary (Signed)
Obstetric Discharge Summary Reason for Admission: Eclampsia, Induction of Labor Prenatal Procedures: ultrasound Intrapartum Procedures: vacuum Postpartum Procedures: none Complications-Operative and Postpartum: 1st degree perineal laceration Hemoglobin  Date Value Range Status  08/12/2012 7.6* 12.0 - 15.0 g/dL Final     HCT  Date Value Range Status  08/12/2012 22.4* 36.0 - 46.0 % Final    Physical Exam:  General: alert Lochia: appropriate Uterine Fundus: firm  Discharge Diagnoses: Eclampsia, Late preterm birth (34 weeks).  Discharge Information: Date: 08/13/2012 Activity: pelvic rest Diet: routine Medications: PNV and Vicodin Condition: improved Instructions: refer to practice specific booklet Discharge to: home Follow-up Information    Schedule an appointment as soon as possible for a visit with Levi Aland, MD.   Contact information:   [address] [place] [address] [phone]          Newborn Data: Live born female  Birth Weight: 5 lb 12.6 oz (2625 g) APGAR: 6, 8  Baby remains in NICU for observation.  Ariely Riddell D 08/13/2012, 9:20 AM

## 2012-08-13 NOTE — Progress Notes (Signed)
I visited with pt and family while making rounds on the unit.  Christine Woodard seemed distracted and did not engage in conversation.  She reported that her son is doing well and admitted that it was difficult for her that he was in the NICU.  She did not wish to talk further at this time, but we will continue to follow up with her when we see her in the NICU.  Please also page as needs arise, [phone].  Chaplain Orpha Bur Piedad Standiford 10:40 AM   08/13/12 1000  Clinical Encounter Type  Visited With Patient and family together  Visit Type Spiritual support  Spiritual Encounters  Spiritual Needs Emotional  Stress Factors  Patient Stress Factors (Baby in NICU)

## 2012-08-19 NOTE — Anesthesia Postprocedure Evaluation (Signed)
Anesthesia Post Note  Patient: Christine Woodard  Procedure(s) Performed: * No procedures listed *  Anesthesia type: Epidural  Patient location: Mother/Baby  Post pain: Pain level controlled  Post assessment: Post-op Vital signs reviewed  Last Vitals: There were no vitals filed for this visit.  Post vital signs: Reviewed  Level of consciousness: awake  Complications: No apparent anesthesia complications

## 2012-08-27 ENCOUNTER — Other Ambulatory Visit: Payer: Self-pay | Admitting: Obstetrics and Gynecology

## 2012-08-27 DIAGNOSIS — G44009 Cluster headache syndrome, unspecified, not intractable: Secondary | ICD-10-CM

## 2012-08-27 DIAGNOSIS — O151 Eclampsia in labor: Secondary | ICD-10-CM

## 2012-08-28 ENCOUNTER — Ambulatory Visit
Admission: RE | Admit: 2012-08-28 | Discharge: 2012-08-28 | Disposition: A | Discharge status: Home / Self Care | Payer: Medicaid Other | Source: Ambulatory Visit | Attending: Obstetrics and Gynecology | Admitting: Obstetrics and Gynecology

## 2012-08-28 DIAGNOSIS — G44009 Cluster headache syndrome, unspecified, not intractable: Secondary | ICD-10-CM

## 2012-08-28 DIAGNOSIS — O151 Eclampsia in labor: Secondary | ICD-10-CM

## 2013-06-15 ENCOUNTER — Emergency Department (HOSPITAL_COMMUNITY)
Admission: EM | Admit: 2013-06-15 | Discharge: 2013-06-15 | Disposition: A | Discharge status: Home / Self Care | Payer: BC Managed Care – PPO | Attending: Emergency Medicine | Admitting: Emergency Medicine

## 2013-06-15 ENCOUNTER — Encounter (HOSPITAL_COMMUNITY): Payer: Self-pay | Admitting: *Deleted

## 2013-06-15 DIAGNOSIS — Z91040 Latex allergy status: Secondary | ICD-10-CM | POA: Insufficient documentation

## 2013-06-15 DIAGNOSIS — J351 Hypertrophy of tonsils: Secondary | ICD-10-CM | POA: Insufficient documentation

## 2013-06-15 DIAGNOSIS — J029 Acute pharyngitis, unspecified: Secondary | ICD-10-CM

## 2013-06-15 DIAGNOSIS — IMO0001 Reserved for inherently not codable concepts without codable children: Secondary | ICD-10-CM | POA: Insufficient documentation

## 2013-06-15 DIAGNOSIS — R509 Fever, unspecified: Secondary | ICD-10-CM | POA: Insufficient documentation

## 2013-06-15 DIAGNOSIS — R52 Pain, unspecified: Secondary | ICD-10-CM | POA: Insufficient documentation

## 2013-06-15 DIAGNOSIS — Z79899 Other long term (current) drug therapy: Secondary | ICD-10-CM | POA: Insufficient documentation

## 2013-06-15 HISTORY — DX: Dorsalgia, unspecified: M54.9

## 2013-06-15 MED ORDER — AMOXICILLIN 500 MG PO CAPS: 500.0000 mg | ORAL_CAPSULE | Freq: Three times a day (TID) | ORAL | Status: AC

## 2013-06-15 MED ORDER — IBUPROFEN 800 MG PO TABS
800.0000 mg | ORAL_TABLET | Freq: Once | ORAL | Status: AC
  Administered 2013-06-15: 800 mg via ORAL
  Filled 2013-06-15: qty 1

## 2013-06-15 MED ORDER — IBUPROFEN 600 MG PO TABS: 600.0000 mg | ORAL_TABLET | Freq: Four times a day (QID) | ORAL | Status: DC | PRN

## 2013-06-15 MED ORDER — AMOXICILLIN 250 MG PO CAPS
500.0000 mg | ORAL_CAPSULE | Freq: Once | ORAL | Status: AC
  Administered 2013-06-15: 500 mg via ORAL
  Filled 2013-06-15: qty 2

## 2013-06-15 NOTE — ED Provider Notes (Signed)
CSN: [id number]     Arrival date & time 06/15/13  1550 History   First MD Initiated Contact with Patient 06/15/13 1625     Chief Complaint  Patient presents with  . Fever  . Generalized Body Aches  . Sore Throat   (Consider location/radiation/quality/duration/timing/severity/associated sxs/prior Treatment) HPI Comments: Christine Woodard is a 22 y.o. Female presenting with 12 hour history of sore throat,  Fever, generalized body aches and myalgias.  She denies nasal congestion or drainage, no ear pain or drainage but does have a generalized headache.  She took tylenol this am which briefly relieved her symptoms..  Symptoms due to not include shortness of breath, chest pain,  Nausea, vomiting or diarrhea. She reports her son had a sore throat earlier this week which has resolved.       The history is provided by the patient.    Past Medical History  Diagnosis Date  . Headache(784.0)   . Back pain    History reviewed. No pertinent past surgical history. History reviewed. No pertinent family history. History  Substance Use Topics  . Smoking status: Never Smoker   . Smokeless tobacco: Not on file  . Alcohol Use: No   OB History   Grav Para Term Preterm Abortions TAB SAB Ect Mult Living   1 1 0 1 0 0 0 0 0 1      Review of Systems  Constitutional: Negative for fever.  HENT: Positive for sore throat. Negative for ear pain, congestion, rhinorrhea, trouble swallowing, neck pain, neck stiffness and voice change.   Eyes: Negative.   Respiratory: Negative for cough, chest tightness and shortness of breath.   Cardiovascular: Negative for chest pain.  Gastrointestinal: Negative for nausea and abdominal pain.  Genitourinary: Negative.   Musculoskeletal: Negative for joint swelling and arthralgias.  Skin: Negative.  Negative for rash and wound.  Neurological: Negative for dizziness, weakness, light-headedness, numbness and headaches.  Psychiatric/Behavioral: Negative.     Allergies   Latex  Home Medications   Current Outpatient Rx  Name  Route  Sig  Dispense  Refill  . amoxicillin (AMOXIL) 500 MG capsule   Oral   Take 1 capsule (500 mg total) by mouth 3 (three) times daily.   30 capsule   0   . docusate sodium (COLACE) 100 MG capsule   Oral   Take 1 capsule (100 mg total) by mouth 2 (two) times daily.   60 capsule   0   . flintstones complete (FLINTSTONES) 60 MG chewable tablet   Oral   Chew 1 tablet by mouth 2 (two) times daily.         Marland Kitchen HYDROcodone-acetaminophen (NORCO) 10-325 MG per tablet   Oral   Take 1-2 tablets by mouth every 4 (four) hours as needed.   20 tablet   0   . HYDROcodone-acetaminophen (VICODIN) 5-500 MG per tablet   Oral   Take 1 tablet by mouth every 6 (six) hours as needed for pain.   20 tablet   0   . ibuprofen (ADVIL,MOTRIN) 600 MG tablet   Oral   Take 1 tablet (600 mg total) by mouth every 6 (six) hours as needed for pain.   30 tablet   0    BP 124/76  Pulse 119  Temp(Src) 102.6 F (39.2 C) (Oral)  Resp 18  Ht 5\' 4"  (1.626 m)  Wt 222 lb (100.699 kg)  BMI 38.09 kg/m2  SpO2 99%  LMP 05/30/2013 Physical Exam  Constitutional: She is  oriented to person, place, and time. She appears well-developed and well-nourished.  HENT:  Head: Normocephalic and atraumatic.  Right Ear: Tympanic membrane and ear canal normal.  Left Ear: Tympanic membrane and ear canal normal.  Nose: Nose normal. No mucosal edema or rhinorrhea.  Mouth/Throat: Uvula is midline and mucous membranes are normal. Posterior oropharyngeal erythema present. No oropharyngeal exudate or tonsillar abscesses.  Mild tonsillar hypertrophy.  Soft palate petechiae, few vesicles.    Eyes: Conjunctivae are normal.  Cardiovascular: Normal rate and normal heart sounds.   Pulmonary/Chest: Effort normal. No respiratory distress. She has no wheezes. She has no rales.  Abdominal: Soft. There is no tenderness.  Musculoskeletal: Normal range of motion.  Neurological:  She is alert and oriented to person, place, and time.  Skin: Skin is warm and dry. No rash noted.  Psychiatric: She has a normal mood and affect.    ED Course  Procedures (including critical care time) Labs Review Labs Reviewed - No data to display Imaging Review No results found.  MDM   1. Pharyngitis, acute    Prescribed amoxil, ibuprofen,  With first doses given here.  Pt encouraged rest, increased fluid intake,  Recheck here or by pcp if not improving over the next several days.    Burgess Amor, PA-C 06/15/13 2015

## 2013-06-15 NOTE — ED Notes (Addendum)
Sore throat, fever, body aches began this morning and has progressively worsened.  Some nausea, no vomiting  Took Tylenol w/out relief.  Son was sick w/sore throat this week.

## 2013-06-21 NOTE — ED Provider Notes (Signed)
Medical screening examination/treatment/procedure(s) were performed by non-physician practitioner and as supervising physician I was immediately available for consultation/collaboration.  Virat Prather, MD 06/21/13 2227 

## 2014-07-14 ENCOUNTER — Encounter (HOSPITAL_COMMUNITY): Payer: Self-pay | Admitting: *Deleted

## 2015-02-10 ENCOUNTER — Encounter (HOSPITAL_COMMUNITY): Payer: Self-pay | Admitting: Emergency Medicine

## 2015-02-10 ENCOUNTER — Emergency Department (HOSPITAL_COMMUNITY): Payer: Medicaid Other

## 2015-02-10 ENCOUNTER — Emergency Department (HOSPITAL_COMMUNITY)
Admission: EM | Admit: 2015-02-10 | Discharge: 2015-02-10 | Disposition: A | Discharge status: Home / Self Care | Payer: Medicaid Other | Attending: Emergency Medicine | Admitting: Emergency Medicine

## 2015-02-10 DIAGNOSIS — R0789 Other chest pain: Secondary | ICD-10-CM | POA: Insufficient documentation

## 2015-02-10 DIAGNOSIS — Z3202 Encounter for pregnancy test, result negative: Secondary | ICD-10-CM | POA: Insufficient documentation

## 2015-02-10 DIAGNOSIS — Z91040 Latex allergy status: Secondary | ICD-10-CM | POA: Insufficient documentation

## 2015-02-10 DIAGNOSIS — Z880 Allergy status to penicillin: Secondary | ICD-10-CM | POA: Insufficient documentation

## 2015-02-10 DIAGNOSIS — R079 Chest pain, unspecified: Secondary | ICD-10-CM

## 2015-02-10 HISTORY — DX: Unspecified convulsions: R56.9

## 2015-02-10 HISTORY — DX: Eclampsia, unspecified as to time period: O15.9

## 2015-02-10 LAB — PREGNANCY, URINE: Preg Test, Ur: NEGATIVE

## 2015-02-10 MED ORDER — IBUPROFEN 400 MG PO TABS
400.0000 mg | ORAL_TABLET | Freq: Once | ORAL | Status: AC
Start: 2015-02-10 — End: 2015-02-10
  Administered 2015-02-10: 400 mg via ORAL
  Filled 2015-02-10: qty 1

## 2015-02-10 NOTE — ED Notes (Signed)
MD Steinl at bedside. 

## 2015-02-10 NOTE — ED Provider Notes (Signed)
CSN: [id number]     Arrival date & time 02/10/15  1231 History   First MD Initiated Contact with Patient 02/10/15 1304     Chief Complaint  Patient presents with  . Chest Pain     (Consider location/radiation/quality/duration/timing/severity/associated sxs/prior Treatment) Patient is a 24 y.o. female presenting with chest pain. The history is provided by the patient.  Chest Pain Associated symptoms: no abdominal pain, no back pain, no fever, no headache, no shortness of breath and not vomiting   Patient presents w midline, upper sternal area chest pain for the past day. States same symptoms for past few months. Onset at rest, and will stay all day, or for couple days. No specific exacerbating or alleviating factors. No relation to position, activity or exertion. Pt denies chest wall strain or injury. Pain is not pleuritic. Pain is dull, moderate, non radiating. No associated sob, nv or diaphoresis. No heartburn, gas, bloating or hx reflux. No back pain or neck pain. No abd pain. No fever or chills. No leg pain or swelling. No recent immobility, trauma, surgery or travel. No hx chd, or heart disease. No hx dvt or pe. Non smoker. No drug use. No cough or uri c/o.       Past Medical History  Diagnosis Date  . Headache(784.0)   . Back pain   . Seizures     during pregnancy  . Eclampsia    History reviewed. No pertinent past surgical history. No family history on file. History  Substance Use Topics  . Smoking status: Never Smoker   . Smokeless tobacco: Not on file  . Alcohol Use: No   OB History    Gravida Para Term Preterm AB TAB SAB Ectopic Multiple Living   1 1 0 1 0 0 0 0 0 1      Review of Systems  Constitutional: Negative for fever and chills.  HENT: Negative for sore throat.   Eyes: Negative for redness.  Respiratory: Negative for shortness of breath.   Cardiovascular: Positive for chest pain. Negative for leg swelling.  Gastrointestinal: Negative for vomiting,  abdominal pain and diarrhea.  Genitourinary: Negative for flank pain.  Musculoskeletal: Negative for back pain and neck pain.  Skin: Negative for rash.  Neurological: Negative for headaches.  Hematological: Does not bruise/bleed easily.  Psychiatric/Behavioral: Negative for confusion.      Allergies  Amoxicillin and Latex  Home Medications   Prior to Admission medications   Medication Sig Start Date End Date Taking? Authorizing Provider  docusate sodium (COLACE) 100 MG capsule Take 1 capsule (100 mg total) by mouth 2 (two) times daily. Patient not taking: Reported on 02/10/2015 08/09/12   Waynard ReedsKendra Ross, MD  HYDROcodone-acetaminophen [facility](NORCO) 10-325 MG per tablet Take 1-2 tablets by mouth every 4 (four) hours as needed. Patient not taking: Reported on 02/10/2015 08/13/12   Ilda Moriichard Kaplan, MD  HYDROcodone-acetaminophen (VICODIN) 5-500 MG per tablet Take 1 tablet by mouth every 6 (six) hours as needed for pain. Patient not taking: Reported on 02/10/2015 08/13/12   Ilda Moriichard Kaplan, MD  ibuprofen (ADVIL,MOTRIN) 600 MG tablet Take 1 tablet (600 mg total) by mouth every 6 (six) hours as needed for pain. Patient not taking: Reported on 02/10/2015 06/15/13   Burgess AmorJulie Idol, PA-C   BP 125/93 mmHg  Pulse 89  Temp(Src) 98 F (36.7 C)  Resp 16  Ht 5\' 2"  (1.575 m)  Wt 190 lb (86.183 kg)  BMI 34.74 kg/m2  SpO2 100%  LMP 12/12/2014 Physical Exam  Constitutional: She  appears well-developed and well-nourished. No distress.  HENT:  Mouth/Throat: Oropharynx is clear and moist.  Eyes: Conjunctivae are normal. No scleral icterus.  Neck: Neck supple. No tracheal deviation present. No thyromegaly present.  Cardiovascular: Normal rate, regular rhythm, normal heart sounds and intact distal pulses.   Pulmonary/Chest: Effort normal and breath sounds normal. No respiratory distress. She exhibits no tenderness.  Abdominal: Soft. Normal appearance and bowel sounds are normal. She exhibits no distension. There is no  tenderness.  Musculoskeletal: She exhibits no edema or tenderness.  Neurological: She is alert.  Skin: Skin is warm and dry. No rash noted. She is not diaphoretic.  Psychiatric: She has a normal mood and affect.  Nursing note and vitals reviewed.   ED Course  Procedures (including critical care time) Labs Review  Results for orders placed or performed during the hospital encounter of 02/10/15  Pregnancy, urine  Result Value Ref Range   Preg Test, Ur NEGATIVE NEGATIVE   Dg Chest 2 View  02/10/2015   CLINICAL DATA:  Chest pain.  EXAM: CHEST  2 VIEW  COMPARISON:  August 08, 2012.  FINDINGS: The heart size and mediastinal contours are within normal limits. Both lungs are clear. No pneumothorax or pleural effusion is noted. The visualized skeletal structures are unremarkable.  IMPRESSION: No active cardiopulmonary disease.   Electronically Signed   By: Lupita Raider, M.D.   On: 02/10/2015 13:35        EKG Interpretation   Date/Time:  Tuesday Feb 10 2015 12:51:55 EDT Ventricular Rate:  72 PR Interval:  141 QRS Duration: 96 QT Interval:  385 QTC Calculation: 421 R Axis:   67 Text Interpretation:  Sinus rhythm No previous tracing Confirmed by Denton Lank   MD, Caryn Bee ([id number]) on 02/10/2015 3:03:42 PM      MDM   Iv ns. Cxr. Ecg.  Reviewed nursing notes and prior charts for additional history.   Motrin po.  Discussed diff dx w pt, feel most likely chest wall/musculsk cp.  Pt currently appears stable for d/c.  Hr 62, 66 14, pulse ox 100% room air.   Return precautions provided. rec pcp f/u.      Cathren Laine, MD 02/10/15 [phone]

## 2015-02-10 NOTE — ED Notes (Signed)
Pt c/o intermittent episodes of cp/sob and upper abd pain "for a few months". States woke up with worsening pain today.

## 2015-02-10 NOTE — Discharge Instructions (Signed)
It was our pleasure to provide your ER care today - we hope that you feel better.  Take motrin or aleve as need for pain.  Follow up with primary care doctor in coming week for recheck.  Return to ER if worse, new symptoms, fevers, trouble breathing, other concern.    Costochondritis Costochondritis, sometimes called Tietze syndrome, is a swelling and irritation (inflammation) of the tissue (cartilage) that connects your ribs with your breastbone (sternum). It causes pain in the chest and rib area. Costochondritis usually goes away on its own over time. It can take up to 6 weeks or longer to get better, especially if you are unable to limit your activities. CAUSES  Some cases of costochondritis have no known cause. Possible causes include:  Injury (trauma).  Exercise or activity such as lifting.  Severe coughing. SIGNS AND SYMPTOMS  Pain and tenderness in the chest and rib area.  Pain that gets worse when coughing or taking deep breaths.  Pain that gets worse with specific movements. DIAGNOSIS  Your health care provider will do a physical exam and ask about your symptoms. Chest X-rays or other tests may be done to rule out other problems. TREATMENT  Costochondritis usually goes away on its own over time. Your health care provider may prescribe medicine to help relieve pain. HOME CARE INSTRUCTIONS   Avoid exhausting physical activity. Try not to strain your ribs during normal activity. This would include any activities using chest, abdominal, and side muscles, especially if heavy weights are used.  Apply ice to the affected area for the first 2 days after the pain begins.  Put ice in a plastic bag.  Place a towel between your skin and the bag.  Leave the ice on for 20 minutes, 2-3 times a day.  Only take over-the-counter or prescription medicines as directed by your health care provider. SEEK MEDICAL CARE IF:  You have redness or swelling at the rib joints. These are  signs of infection.  Your pain does not go away despite rest or medicine. SEEK IMMEDIATE MEDICAL CARE IF:   Your pain increases or you are very uncomfortable.  You have shortness of breath or difficulty breathing.  You cough up blood.  You have worse chest pains, sweating, or vomiting.  You have a fever or persistent symptoms for more than 2-3 days.  You have a fever and your symptoms suddenly get worse. MAKE SURE YOU:   Understand these instructions.  Will watch your condition.  Will get help right away if you are not doing well or get worse. Document Released: 06/08/2005 Document Revised: 06/19/2013 Document Reviewed: 04/02/2013 [facility] Patient Information 2015 [place]. This information is not intended to replace advice given to you by your health care provider. Make sure you discuss any questions you have with your health care provider.      Chest Pain (Nonspecific) It is often hard to give a diagnosis for the cause of chest pain. There is always a chance that your pain could be related to something serious, such as a heart attack or a blood clot in the lungs. You need to follow up with your doctor. HOME CARE  If antibiotic medicine was given, take it as directed by your doctor. Finish the medicine even if you start to feel better.  For the next few days, avoid activities that bring on chest pain. Continue physical activities as told by your doctor.  Do not use any tobacco products. This includes cigarettes, chewing tobacco, and  e-cigarettes.  Avoid drinking alcohol.  Only take medicine as told by your doctor.  Follow your doctor's suggestions for more testing if your chest pain does not go away.  Keep all doctor visits you made. GET HELP IF:  Your chest pain does not go away, even after treatment.  You have a rash with blisters on your chest.  You have a fever. GET HELP RIGHT AWAY IF:   You have more pain or pain that spreads to your arm, neck,  jaw, back, or belly (abdomen).  You have shortness of breath.  You cough more than usual or cough up blood.  You have very bad back or belly pain.  You feel sick to your stomach (nauseous) or throw up (vomit).  You have very bad weakness.  You pass out (faint).  You have chills. This is an emergency. Do not wait to see if the problems will go away. Call your local emergency services (911 in [place] Do not drive yourself to the hospital. MAKE SURE YOU:   Understand these instructions.  Will watch your condition.  Will get help right away if you are not doing well or get worse. Document Released: 02/15/2008 Document Revised: 09/03/2013 Document Reviewed: 02/15/2008 [facility] Patient Information 2015 [place], [place]. This information is not intended to replace advice given to you by your health care provider. Make sure you discuss any questions you have with your health care provider.

## 2017-06-09 ENCOUNTER — Emergency Department (HOSPITAL_COMMUNITY)
Admission: EM | Admit: 2017-06-09 | Discharge: 2017-06-09 | Disposition: A | Discharge status: Home / Self Care | Payer: BLUE CROSS/BLUE SHIELD | Attending: Emergency Medicine | Admitting: Emergency Medicine

## 2017-06-09 ENCOUNTER — Encounter (HOSPITAL_COMMUNITY): Payer: Self-pay | Admitting: *Deleted

## 2017-06-09 DIAGNOSIS — Z91040 Latex allergy status: Secondary | ICD-10-CM | POA: Insufficient documentation

## 2017-06-09 DIAGNOSIS — N3001 Acute cystitis with hematuria: Secondary | ICD-10-CM | POA: Insufficient documentation

## 2017-06-09 DIAGNOSIS — Z79899 Other long term (current) drug therapy: Secondary | ICD-10-CM | POA: Insufficient documentation

## 2017-06-09 DIAGNOSIS — N939 Abnormal uterine and vaginal bleeding, unspecified: Secondary | ICD-10-CM | POA: Diagnosis present

## 2017-06-09 LAB — URINALYSIS, MICROSCOPIC (REFLEX)

## 2017-06-09 LAB — PREGNANCY, URINE: Preg Test, Ur: NEGATIVE

## 2017-06-09 LAB — URINALYSIS, ROUTINE W REFLEX MICROSCOPIC
Glucose, UA: 100 mg/dL — AB
NITRITE: POSITIVE — AB
PH: 6.5 (ref 5.0–8.0)
Specific Gravity, Urine: >1.03 — ABNORMAL HIGH (ref 1.005–1.030)

## 2017-06-09 MED ORDER — SULFAMETHOXAZOLE-TRIMETHOPRIM 800-160 MG PO TABS: 1.0000 | ORAL_TABLET | Freq: Two times a day (BID) | ORAL | 0 refills | Status: AC

## 2017-06-09 MED ORDER — SULFAMETHOXAZOLE-TRIMETHOPRIM 800-160 MG PO TABS
1.0000 | ORAL_TABLET | Freq: Once | ORAL | Status: AC
  Administered 2017-06-09: 1 via ORAL
  Filled 2017-06-09: qty 1

## 2017-06-09 MED ORDER — PHENAZOPYRIDINE HCL 200 MG PO TABS: 200.0000 mg | ORAL_TABLET | Freq: Three times a day (TID) | ORAL | 0 refills | Status: DC

## 2017-06-09 MED ORDER — PHENAZOPYRIDINE HCL 100 MG PO TABS
200.0000 mg | ORAL_TABLET | Freq: Once | ORAL | Status: AC
  Administered 2017-06-09: 200 mg via ORAL
  Filled 2017-06-09: qty 2

## 2017-06-09 NOTE — ED Triage Notes (Signed)
Pt with blood with urination, possible pregnant but took a home pregnancy test couple days ago and was negative.

## 2017-06-09 NOTE — ED Notes (Signed)
Pt states understanding of care given and follow up instructions.  Pt a/o ambulated from ED with steady gait 

## 2017-06-11 NOTE — ED Provider Notes (Signed)
[facility]EM[facility]ENCY DEPT Provider Note   CSN: [id number] Arrival date & time: 06/09/17  1707     History   Chief Complaint Chief Complaint  Patient presents with  . Vaginal Bleeding    HPI Christine Woodard is a 26 y.o. female with a 24 hour history of urinary frequency, dysuria, urgency and pink tinged urine. She denies nausea, vomiting, fevers, chills and back or flank pain and has had no vaginal discharge.  She has recently had irregular periods with her LMP 04/24/17.  A home pregnancy test several days ago was negative.  The history is provided by the patient.    Past Medical History:  Diagnosis Date  . Back pain   . Eclampsia   . Headache(784.0)   . Seizures (HCC)    during pregnancy    There are no active problems to display for this patient.   History reviewed. No pertinent surgical history.  OB History    Gravida Para Term Preterm AB Living   1 1 0 1 0 1   SAB TAB Ectopic Multiple Live Births   0 0 0 0 1       Home Medications    Prior to Admission medications   Medication Sig Start Date End Date Taking? Authorizing Provider  docusate sodium (COLACE) 100 MG capsule Take 1 capsule (100 mg total) by mouth 2 (two) times daily. Patient not taking: Reported on 02/10/2015 08/09/12   Waynard Reeds, MD  HYDROcodone-acetaminophen [facility]) 10-325 MG per tablet Take 1-2 tablets by mouth every 4 (four) hours as needed. Patient not taking: Reported on 02/10/2015 08/13/12   Ilda Mori, MD  HYDROcodone-acetaminophen (VICODIN) 5-500 MG per tablet Take 1 tablet by mouth every 6 (six) hours as needed for pain. Patient not taking: Reported on 02/10/2015 08/13/12   Ilda Mori, MD  ibuprofen (ADVIL,MOTRIN) 600 MG tablet Take 1 tablet (600 mg total) by mouth every 6 (six) hours as needed for pain. Patient not taking: Reported on 02/10/2015 06/15/13   Burgess Amor, PA-C  phenazopyridine (PYRIDIUM) 200 MG tablet Take 1 tablet (200 mg total) by mouth 3 (three) times daily. 06/09/17    Burgess Amor, PA-C  sulfamethoxazole-trimethoprim (BACTRIM DS,SEPTRA DS) 800-160 MG tablet Take 1 tablet by mouth 2 (two) times daily. 06/09/17 06/16/17  Burgess Amor, PA-C    Family History History reviewed. No pertinent family history.  Social History Social History  Substance Use Topics  . Smoking status: Never Smoker  . Smokeless tobacco: Never Used  . Alcohol use No     Allergies   Amoxicillin and Latex   Review of Systems Review of Systems  Constitutional: Negative for fever.  HENT: Negative for congestion and sore throat.   Eyes: Negative.   Respiratory: Negative for chest tightness and shortness of breath.   Cardiovascular: Negative for chest pain.  Gastrointestinal: Negative for abdominal pain and nausea.  Genitourinary: Positive for dysuria, frequency, hematuria and urgency. Negative for vaginal discharge.  Musculoskeletal: Negative for arthralgias, joint swelling and neck pain.  Skin: Negative.  Negative for rash and wound.  Neurological: Negative for dizziness, weakness, light-headedness, numbness and headaches.  Psychiatric/Behavioral: Negative.      Physical Exam Updated Vital Signs BP 134/83 (BP Location: Right Arm)   Pulse 98   Temp 98 F (36.7 C) (Oral)   Resp 18   Ht  (1.6 m)   Wt 84.8 kg (187 lb)   LMP 04/24/2017   SpO2 100%   BMI 33.13 kg/m   Physical Exam  Constitutional: She appears well-developed and well-nourished.  HENT:  Head: Normocephalic and atraumatic.  Eyes: Conjunctivae are normal.  Neck: Normal range of motion.  Cardiovascular: Normal rate, regular rhythm, normal heart sounds and intact distal pulses.   Pulmonary/Chest: Effort normal and breath sounds normal. She has no wheezes.  Abdominal: Soft. Bowel sounds are normal. There is tenderness in the suprapubic area. There is no CVA tenderness.  Mild suprapubic ttp. No guarding.  Musculoskeletal: Normal range of motion.  Neurological: She is alert.  Skin: Skin is warm and  dry.  Psychiatric: She has a normal mood and affect.  Nursing note and vitals reviewed.    ED Treatments / Results  Labs (all labs ordered are listed, but only abnormal results are displayed) Labs Reviewed  URINE CULTURE - Abnormal; Notable for the following:       Result Value   Culture >=100,000 COLONIES/mL ENTEROBACTER AEROGENES (*)    All other components within normal limits  URINALYSIS, ROUTINE W REFLEX MICROSCOPIC - Abnormal; Notable for the following:    Color, Urine AMBER (*)    APPearance HAZY (*)    Specific Gravity, Urine >1.030 (*)    Glucose, UA 100 (*)    Hgb urine dipstick LARGE (*)    Bilirubin Urine MODERATE (*)    Ketones, ur TRACE (*)    Protein, ur >300 (*)    Nitrite POSITIVE (*)    Leukocytes, UA MODERATE (*)    All other components within normal limits  URINALYSIS, MICROSCOPIC (REFLEX) - Abnormal; Notable for the following:    Bacteria, UA RARE (*)    Squamous Epithelial / LPF 0-5 (*)    All other components within normal limits  PREGNANCY, URINE    EKG  EKG Interpretation None       Radiology No results found.  Procedures Procedures (including critical care time)  Medications Ordered in ED Medications  phenazopyridine (PYRIDIUM) tablet 200 mg (200 mg Oral Given 06/09/17 1829)  sulfamethoxazole-trimethoprim (BACTRIM DS,SEPTRA DS) 800-160 MG per tablet 1 tablet (1 tablet Oral Given 06/09/17 1829)     Initial Impression / Assessment and Plan / ED Course  I have reviewed the triage vital signs and the nursing notes.  Pertinent labs & imaging results that were available during my care of the patient were reviewed by me and considered in my medical decision making (see chart for details).     Bactrim, pyridium, cx sent. Strict return precautions discussed.   Final Clinical Impressions(s) / ED Diagnoses   Final diagnoses:  Acute cystitis with hematuria    New Prescriptions Discharge Medication List as of 06/09/2017  6:22 PM      START taking these medications   Details  phenazopyridine (PYRIDIUM) 200 MG tablet Take 1 tablet (200 mg total) by mouth 3 (three) times daily., Starting Fri 06/09/2017, Print    sulfamethoxazole-trimethoprim (BACTRIM DS,SEPTRA DS) 800-160 MG tablet Take 1 tablet by mouth 2 (two) times daily., Starting Fri 06/09/2017, Until Fri 06/16/2017, Print         Burgess Amor, PA-C 06/11/17 1929    Blane Ohara, MD 06/11/17 [phone]

## 2017-06-12 LAB — URINE CULTURE

## 2017-06-13 ENCOUNTER — Telehealth: Payer: Self-pay | Admitting: Emergency Medicine

## 2017-06-13 NOTE — Telephone Encounter (Signed)
Post ED Visit - Positive Culture Follow-up  Culture report reviewed by antimicrobial stewardship pharmacist:   Enzo Bi, Pharm.D.  Celedonio Miyamoto, Pharm.D., BCPS AQ-ID  Garvin Fila, Pharm.D., BCPS  Georgina Pillion, [address].D., BCPS  [place], [address].D., BCPS, AAHIVP  Estella Husk, Pharm.D., BCPS, AAHIVP  Lysle Pearl, PharmD, BCPS  Casilda Carls, PharmD, BCPS  Pollyann Samples, PharmD, BCPS  Positive urine culture Treated with sulfamethoxazole-trimethoprim, organism sensitive to the same and no further patient follow-up is required at this time.  Berle Mull 06/13/2017, 4:16 PM

## 2017-09-06 ENCOUNTER — Other Ambulatory Visit: Payer: Self-pay

## 2017-09-08 ENCOUNTER — Other Ambulatory Visit (HOSPITAL_COMMUNITY): Payer: Self-pay | Admitting: Obstetrics and Gynecology

## 2017-09-08 DIAGNOSIS — Z3689 Encounter for other specified antenatal screening: Secondary | ICD-10-CM

## 2017-09-08 DIAGNOSIS — Z3A19 19 weeks gestation of pregnancy: Secondary | ICD-10-CM

## 2017-09-12 NOTE — L&D Delivery Note (Signed)
Delivery Note Patient was admitted in active labor and rapidly progressed to 10 cm.  She pushed well for < 10 minutes.  At 6:37 AM a viable female was delivered via Vaginal, Spontaneous (Presentation: OA).  APGAR: 7, 9; weight pending .   Placenta status: Spontaneous, in tact Cord: 3V  with the following complications: None.  Cord pH: n/a  Anesthesia:  Epidural  Episiotomy: None Lacerations: Labial;1st degree Suture Repair: 3.0 vicryl rapide Est. Blood Loss (mL):  300 mL  Mom to postpartum.  Baby to Couplet care / Skin to Skin.  Christine Woodard [name] 03/15/2018, 7:11 AM

## 2017-09-15 ENCOUNTER — Other Ambulatory Visit: Payer: Self-pay

## 2017-10-11 ENCOUNTER — Encounter (HOSPITAL_COMMUNITY): Payer: Self-pay | Admitting: Obstetrics and Gynecology

## 2017-10-17 ENCOUNTER — Encounter (HOSPITAL_COMMUNITY): Payer: Self-pay | Admitting: *Deleted

## 2017-10-19 ENCOUNTER — Other Ambulatory Visit (HOSPITAL_COMMUNITY): Payer: Self-pay | Admitting: Obstetrics and Gynecology

## 2017-10-19 ENCOUNTER — Ambulatory Visit (HOSPITAL_COMMUNITY)
Admission: RE | Admit: 2017-10-19 | Discharge: 2017-10-19 | Disposition: A | Discharge status: Home / Self Care | Payer: Medicaid Other | Source: Ambulatory Visit | Attending: Obstetrics and Gynecology | Admitting: Obstetrics and Gynecology

## 2017-10-19 ENCOUNTER — Encounter (HOSPITAL_COMMUNITY): Payer: Self-pay

## 2017-10-19 DIAGNOSIS — Z3689 Encounter for other specified antenatal screening: Secondary | ICD-10-CM

## 2017-10-19 DIAGNOSIS — O09292 Supervision of pregnancy with other poor reproductive or obstetric history, second trimester: Secondary | ICD-10-CM | POA: Insufficient documentation

## 2017-10-19 DIAGNOSIS — Z3A19 19 weeks gestation of pregnancy: Secondary | ICD-10-CM | POA: Diagnosis not present

## 2017-10-19 DIAGNOSIS — O09299 Supervision of pregnancy with other poor reproductive or obstetric history, unspecified trimester: Secondary | ICD-10-CM

## 2017-10-19 DIAGNOSIS — O289 Unspecified abnormal findings on antenatal screening of mother: Secondary | ICD-10-CM | POA: Insufficient documentation

## 2017-10-19 DIAGNOSIS — O09899 Supervision of other high risk pregnancies, unspecified trimester: Secondary | ICD-10-CM

## 2017-10-19 DIAGNOSIS — O09212 Supervision of pregnancy with history of pre-term labor, second trimester: Secondary | ICD-10-CM | POA: Diagnosis not present

## 2017-10-19 DIAGNOSIS — O99212 Obesity complicating pregnancy, second trimester: Secondary | ICD-10-CM

## 2017-10-19 DIAGNOSIS — O285 Abnormal chromosomal and genetic finding on antenatal screening of mother: Secondary | ICD-10-CM | POA: Insufficient documentation

## 2017-10-19 DIAGNOSIS — O09219 Supervision of pregnancy with history of pre-term labor, unspecified trimester: Secondary | ICD-10-CM

## 2017-10-19 DIAGNOSIS — Z363 Encounter for antenatal screening for malformations: Secondary | ICD-10-CM | POA: Insufficient documentation

## 2017-10-19 DIAGNOSIS — E669 Obesity, unspecified: Secondary | ICD-10-CM | POA: Insufficient documentation

## 2017-10-19 NOTE — Progress Notes (Signed)
Genetic Counseling  High-Risk Gestation Note  Appointment Date:  10/19/2017 Referred By: Levi AlandAnderson, Mark E, MD Date of Birth:  [birth year]   Pregnancy History: G2P0101 Estimated Date of Delivery: 03/15/18 Estimated Gestational Age: 5617w0d Attending: Particia NearingMartha Decker, MD   Ms. Christine Woodard was seen for genetic counseling because noninvasive prenatal screening (NIPS)/prenatal cell free DNA, [facility], yielded no results due to uninformative DNA pattern.  In summary:  Reviewed results of NIPS  Discussed various causes for uninformative DNA pattern including normal population variation and possible association with underlying aneuploidy  Reviewed risks for nondisjunction related to age of ovum  Discussed additional screening options  Quad screen- declined  NIPS through different lab that uses different methodology for analysis- declined  Ultrasound- performed today, within normal limits; see separate ultrasound report  Discussed diagnostic testing options  Amniocentesis- declined  Reviewed family history concerns  Ms. Christine Woodard had noninvasive prenatal screening (NIPS)/prenatal cell free DNA testing performed through her OB office. This testing, [facility], yielded no results given an uninformative DNA pattern. We spent time reviewing chromosomes, genes, nondisjunction, and examples of fetal aneuploidy. We discussed that given the patient's age alone, the pregnancy would not be considered at increased risk for fetal aneuploidy. We reviewed the methodology behind single nucleotide polymorphism based NIPS and the conditions for which it typically assesses. We discussed various causes for no results from NIPS. In the case of an uninformative DNA pattern, we discussed that repeat sample to the same laboratory would not be expected to yield results. We discussed that causes for an uninformative DNA pattern include normal population variation,  consanguinity, or in some cases could be due to underlying chromosome aberrations such as fetal aneuploidy.   We discussed additional screening and testing options for fetal aneuploidy in pregnancy including benefits and limitations of targeted ultrasound. We discussed the option of maternal serum screening (Quad screen) including the conditions for which it assesses and the detection rates of each. We also discussed the option of pursuing NIPS through a different laboratory that employs different methodology for analysis. She understands that these screens can further refine the risk for fetal aneuploidy in pregnancy but are not diagnostic. We also discussed that diagnostic option of amniocentesis. We discussed the associated 1 in 300-500 risk for complications from amniocentesis, including spontaneous pregnancy loss.   Detailed ultrasound was performed today. Visualized fetal anatomy was within normal limits. Complete ultrasound report under separate cover. After careful consideration, Ms. Christine Woodard declined further screening and testing for fetal aneuploidy including Quad screen, NIPS through different laboratory, and amniocentesis.  She understands that screening tests cannot rule out all birth defects or genetic syndromes. The patient was advised of this limitation and states she still does not want additional testing at this time.   Ms. Christine Woodard was provided with written information regarding cystic fibrosis (CF), spinal muscular atrophy (SMA) and hemoglobinopathies including the carrier frequency, availability of carrier screening and prenatal diagnosis if indicated.  In addition, we discussed that CF and hemoglobinopathies are routinely screened for as part of the [place] newborn screening panel.  CF, SMA, DMD, and Fragile X carrier screening were previously performed through her OB provider, and were within normal limits. We discussed that this significantly reduces, but does not eliminate the chance  for these conditions in the pregnancy.    Both family histories were reviewed and found to be noncontributory for birth defects, intellectual disability, recurrent pregnancy loss, and known genetic conditions. Consanguinity for  the couple was denied.  Without further information regarding the provided family history, an accurate genetic risk cannot be calculated. Further genetic counseling is warranted if more information is obtained.  Ms. Christine Croak denied exposure to environmental toxins or chemical agents. She denied the use of alcohol, tobacco or street drugs. She denied significant viral illnesses during the course of her pregnancy.   I counseled Ms. Christine Sensing for approximately 40 minutes regarding the above risks and available options.   Quinn Plowman, MS,  Certified Genetic Counselor 10/19/2017

## 2017-11-25 ENCOUNTER — Encounter (HOSPITAL_COMMUNITY): Payer: Self-pay | Admitting: *Deleted

## 2017-11-25 ENCOUNTER — Inpatient Hospital Stay (HOSPITAL_COMMUNITY)
Admission: AD | Admit: 2017-11-25 | Discharge: 2017-11-25 | Disposition: A | Discharge status: Home / Self Care | Payer: Medicaid Other | Source: Ambulatory Visit | Attending: Obstetrics and Gynecology | Admitting: Obstetrics and Gynecology

## 2017-11-25 DIAGNOSIS — Z7982 Long term (current) use of aspirin: Secondary | ICD-10-CM | POA: Insufficient documentation

## 2017-11-25 DIAGNOSIS — R109 Unspecified abdominal pain: Secondary | ICD-10-CM | POA: Insufficient documentation

## 2017-11-25 DIAGNOSIS — Z79899 Other long term (current) drug therapy: Secondary | ICD-10-CM | POA: Diagnosis not present

## 2017-11-25 DIAGNOSIS — O26899 Other specified pregnancy related conditions, unspecified trimester: Secondary | ICD-10-CM

## 2017-11-25 DIAGNOSIS — Z3A24 24 weeks gestation of pregnancy: Secondary | ICD-10-CM | POA: Insufficient documentation

## 2017-11-25 DIAGNOSIS — O26892 Other specified pregnancy related conditions, second trimester: Secondary | ICD-10-CM | POA: Diagnosis not present

## 2017-11-25 LAB — URINALYSIS, ROUTINE W REFLEX MICROSCOPIC
Bilirubin Urine: NEGATIVE
Glucose, UA: NEGATIVE mg/dL
Hgb urine dipstick: NEGATIVE
Ketones, ur: NEGATIVE mg/dL
LEUKOCYTES UA: NEGATIVE
Nitrite: NEGATIVE
PROTEIN: NEGATIVE mg/dL
Specific Gravity, Urine: 1.003 — ABNORMAL LOW (ref 1.005–1.030)
pH: 7 (ref 5.0–8.0)

## 2017-11-25 LAB — CBC
HEMATOCRIT: 30.6 % — AB (ref 36.0–46.0)
Hemoglobin: 10.5 g/dL — ABNORMAL LOW (ref 12.0–15.0)
MCH: 32.5 pg (ref 26.0–34.0)
MCHC: 34.3 g/dL (ref 30.0–36.0)
MCV: 94.7 fL (ref 78.0–100.0)
Platelets: 188 10*3/uL (ref 150–400)
RBC: 3.23 MIL/uL — ABNORMAL LOW (ref 3.87–5.11)
RDW: 12.7 % (ref 11.5–15.5)
WBC: 10.6 10*3/uL — ABNORMAL HIGH (ref 4.0–10.5)

## 2017-11-25 NOTE — MAU Note (Signed)
Pt presents to MAU c/o abdominal pain, pt states the pain is in the middle of her abdomen and it radiates downward. Pt states she was having lower abdominal pain yesterday that was lower and was making it hard to walk but now the pain is in the middle of her stomach. Pt denies bleeding or LOF and reports a DFM.

## 2017-11-25 NOTE — Discharge Instructions (Signed)

## 2017-11-25 NOTE — MAU Provider Note (Signed)
History     CSN: [id number]  Arrival date and time: 11/25/17 [id number]   First Provider Initiated Contact with Patient 11/25/17 [id number]     Chief Complaint  Patient presents with  . Abdominal Pain   HPI Christine Woodard is a 27 y.o. G2P0101 at 6768w2d who presents with upper abdominal pain. She states she woke up at 0330 with mid to upper abdominal pain that was severe and constant. Denies feeling any pain like this before. She states the pain has gotten better since then and now rates it a 3/10. She also states the baby is moving less than normal but is feeling fetal movement since being in MAU. Denies any bleeding or leaking of fluid.   OB History    Gravida Para Term Preterm AB Living   2 1 0 1 0 1   SAB TAB Ectopic Multiple Live Births   0 0 0 0 1      Past Medical History:  Diagnosis Date  . Back pain   . Eclampsia   . Headache(784.0)   . Seizures (HCC)    during pregnancy    History reviewed. No pertinent surgical history.  Family History  Problem Relation Age of Onset  . Diabetes Father   . Diabetes Paternal Grandmother   . Diabetes Paternal Grandfather     Social History   Tobacco Use  . Smoking status: Never Smoker  . Smokeless tobacco: Never Used  Substance Use Topics  . Alcohol use: No  . Drug use: No    Allergies:  Allergies  Allergen Reactions  . Amoxicillin Hives and Swelling    Swelling to throat  . Latex Rash    Medications Prior to Admission  Medication Sig Dispense Refill Last Dose  . aspirin EC 81 MG tablet Take 81 mg by mouth daily.   11/24/2017 at Unknown time  . Prenatal Vit-Fe Fumarate-FA (PRENATAL VITAMIN PO) Take by mouth.   11/24/2017 at Unknown time  . pseudoephedrine-acetaminophen (TYLENOL SINUS) 30-500 MG TABS tablet Take 1 tablet by mouth every 4 (four) hours as needed.     . docusate sodium (COLACE) 100 MG capsule Take 1 capsule (100 mg total) by mouth 2 (two) times daily. (Patient not taking: Reported on 02/10/2015) 60 capsule 0 Not  Taking  . HYDROcodone-acetaminophen (NORCO) 10-325 MG per tablet Take 1-2 tablets by mouth every 4 (four) hours as needed. (Patient not taking: Reported on 02/10/2015) 20 tablet 0 Not Taking  . HYDROcodone-acetaminophen (VICODIN) 5-500 MG per tablet Take 1 tablet by mouth every 6 (six) hours as needed for pain. (Patient not taking: Reported on 02/10/2015) 20 tablet 0 Not Taking  . ibuprofen (ADVIL,MOTRIN) 600 MG tablet Take 1 tablet (600 mg total) by mouth every 6 (six) hours as needed for pain. (Patient not taking: Reported on 02/10/2015) 30 tablet 0 Not Taking  . phenazopyridine (PYRIDIUM) 200 MG tablet Take 1 tablet (200 mg total) by mouth 3 (three) times daily. (Patient not taking: Reported on 10/19/2017) 6 tablet 0 Not Taking    Review of Systems  Constitutional: Negative.  Negative for fatigue and fever.  HENT: Negative.   Respiratory: Negative.  Negative for shortness of breath.   Cardiovascular: Negative.  Negative for chest pain.  Gastrointestinal: Positive for abdominal pain. Negative for constipation, diarrhea, nausea and vomiting.  Genitourinary: Negative.  Negative for dysuria, vaginal bleeding and vaginal discharge.  Neurological: Negative.  Negative for dizziness and headaches.   Physical Exam   Height 5\' 3"  (1.6 m),  weight 212 lb (96.2 kg), last menstrual period 06/01/2017.  Physical Exam  Nursing note and vitals reviewed. Constitutional: She is oriented to person, place, and time. She appears well-developed and well-nourished. No distress.  HENT:  Head: Normocephalic.  Eyes: Pupils are equal, round, and reactive to light.  Cardiovascular: Normal rate, regular rhythm and normal heart sounds.  Respiratory: Effort normal and breath sounds normal. No respiratory distress.  GI: Soft. Bowel sounds are normal. She exhibits no distension. There is no tenderness. There is no rebound and no guarding.  Neurological: She is alert and oriented to person, place, and time.  Skin: Skin is  warm and dry.  Psychiatric: She has a normal mood and affect. Her behavior is normal. Judgment and thought content normal.   Fetal Tracing:  Baseline: 150 Variability: moderate Accels: 10x10 Decels: none  Toco: none  Dilation: Closed Effacement (%): Thick Exam by:: Antony Odea CNM    MAU Course  Procedures Results for orders placed or performed during the hospital encounter of 11/25/17 (from the past 24 hour(s))  Urinalysis, Routine w reflex microscopic     Status: Abnormal   Collection Time: 11/25/17  4:06 AM  Result Value Ref Range   Color, Urine COLORLESS (A) YELLOW   APPearance CLEAR CLEAR   Specific Gravity, Urine 1.003 (L) 1.005 - 1.030   pH 7.0 5.0 - 8.0   Glucose, UA NEGATIVE NEGATIVE mg/dL   Hgb urine dipstick NEGATIVE NEGATIVE   Bilirubin Urine NEGATIVE NEGATIVE   Ketones, ur NEGATIVE NEGATIVE mg/dL   Protein, ur NEGATIVE NEGATIVE mg/dL   Nitrite NEGATIVE NEGATIVE   Leukocytes, UA NEGATIVE NEGATIVE  CBC     Status: Abnormal   Collection Time: 11/25/17  4:41 AM  Result Value Ref Range   WBC 10.6 (H) 4.0 - 10.5 K/uL   RBC 3.23 (L) 3.87 - 5.11 MIL/uL   Hemoglobin 10.5 (L) 12.0 - 15.0 g/dL   HCT 91.4 (L) 78.2 - 95.6 %   MCV 94.7 78.0 - 100.0 fL   MCH 32.5 26.0 - 34.0 pg   MCHC 34.3 30.0 - 36.0 g/dL   RDW 21.3 08.6 - 57.8 %   Platelets 188 150 - 400 K/uL   MDM UA CBC Consulted with Dr. Claiborne Billings- ok to discharge patient home with reassurance  Assessment and Plan   1. Abdominal pain affecting pregnancy   2. [redacted] weeks gestation of pregnancy    -Discharge home in stable condition -Preterm labor precautions discussed -Encouraged patient to use pregnancy support belt for round ligament pain. -Patient advised to follow-up with [facility] as scheduled for prenatal care -Patient may return to MAU as needed or if her condition were to change or worsen  Rolm Bookbinder CNM 11/25/2017, 4:34 AM

## 2018-03-12 ENCOUNTER — Encounter (HOSPITAL_COMMUNITY): Payer: Self-pay | Admitting: *Deleted

## 2018-03-12 ENCOUNTER — Inpatient Hospital Stay (HOSPITAL_COMMUNITY)
Admission: AD | Admit: 2018-03-12 | Discharge: 2018-03-12 | Disposition: A | Discharge status: Home / Self Care | Payer: Medicaid Other | Source: Ambulatory Visit | Attending: Obstetrics and Gynecology | Admitting: Obstetrics and Gynecology

## 2018-03-12 DIAGNOSIS — O368130 Decreased fetal movements, third trimester, not applicable or unspecified: Secondary | ICD-10-CM | POA: Diagnosis not present

## 2018-03-12 DIAGNOSIS — O99353 Diseases of the nervous system complicating pregnancy, third trimester: Secondary | ICD-10-CM | POA: Diagnosis not present

## 2018-03-12 DIAGNOSIS — Z833 Family history of diabetes mellitus: Secondary | ICD-10-CM | POA: Diagnosis not present

## 2018-03-12 DIAGNOSIS — Z3A39 39 weeks gestation of pregnancy: Secondary | ICD-10-CM | POA: Insufficient documentation

## 2018-03-12 DIAGNOSIS — Z880 Allergy status to penicillin: Secondary | ICD-10-CM | POA: Insufficient documentation

## 2018-03-12 DIAGNOSIS — R569 Unspecified convulsions: Secondary | ICD-10-CM | POA: Diagnosis not present

## 2018-03-12 DIAGNOSIS — Z3689 Encounter for other specified antenatal screening: Secondary | ICD-10-CM

## 2018-03-12 NOTE — MAU Note (Signed)
Pt reports she had not felt baby move much over the past few days. Told to come in for evaluation. Reports some sharp abd pain that last for a few minutes and goes away hurts worse when she get up to stand.

## 2018-03-12 NOTE — Discharge Instructions (Signed)

## 2018-03-12 NOTE — MAU Provider Note (Signed)
History     CSN: [id number]  Arrival date and time: 03/12/18 1652   First Provider Initiated Contact with Patient 03/12/18 1732      Chief Complaint  Patient presents with  . Decreased Fetal Movement   G2P0101 @39 .4 wks here with decreased FM x2 days. She is feeling movement every day just not as often. She is eating and drinking well.    OB History    Gravida  2   Para  1   Term  0   Preterm  1   AB  0   Living  1     SAB  0   TAB  0   Ectopic  0   Multiple  0   Live Births  1           Past Medical History:  Diagnosis Date  . Back pain   . Eclampsia   . Headache(784.0)   . Seizures (HCC)    during pregnancy    History reviewed. No pertinent surgical history.  Family History  Problem Relation Age of Onset  . Diabetes Father   . Diabetes Paternal Grandmother   . Diabetes Paternal Grandfather     Social History   Tobacco Use  . Smoking status: Never Smoker  . Smokeless tobacco: Never Used  Substance Use Topics  . Alcohol use: No  . Drug use: No    Allergies:  Allergies  Allergen Reactions  . Amoxicillin Hives and Swelling    Swelling to throat Has patient had a PCN reaction causing immediate rash, facial/tongue/throat swelling, SOB or lightheadedness with hypotension: Yes Has patient had a PCN reaction causing severe rash involving mucus membranes or skin necrosis: No Has patient had a PCN reaction that required hospitalization: No Has patient had a PCN reaction occurring within the last 10 years: No If all of the above answers are "NO", then may proceed with Cephalosporin use.   . Latex Rash    Medications Prior to Admission  Medication Sig Dispense Refill Last Dose  . Prenatal Vit-Fe Fumarate-FA (PRENATAL VITAMIN PO) Take 1 tablet by mouth daily.    03/11/2018 at Unknown time    Review of Systems  Gastrointestinal: Negative for abdominal pain.  Genitourinary: Negative for vaginal bleeding and vaginal discharge.   Physical  Exam   Blood pressure (!) 141/88, pulse 85, temperature 98.2 F (36.8 C), temperature source Oral, resp. rate 20, height 5\' 3"  (1.6 m), weight 240 lb (108.9 kg), last menstrual period 06/01/2017, SpO2 99 %.  Physical Exam  Constitutional: She is oriented to person, place, and time. She appears well-developed and well-nourished. No distress.  HENT:  Head: Normocephalic and atraumatic.  Neck: Normal range of motion.  Cardiovascular: Normal rate.  Respiratory: Effort normal. No respiratory distress.  GI: Soft. She exhibits no distension. There is no tenderness.  gravid  Musculoskeletal: Normal range of motion.  Neurological: She is alert and oriented to person, place, and time.  Skin: Skin is warm and dry.  Psychiatric: She has a normal mood and affect.  EFM: 135 bpm, mod variability, + accels, no decels Toco: irregular  No results found for this or any previous visit (from the past 24 hour(s)).  MAU Course  Procedures  MDM Prenatal records reviewed. Pregnancy is complicated by hx of eclampsia and delivery at 34 wks in G1. Pt feeling more movement since EFM applied. NST reactive. Discussed FMCs. Presentation, clinical findings, and plan discussed with Dr. Dareen PianoAnderson. Stable for discharge home.  Assessment  and Plan   1. [redacted] weeks gestation of pregnancy   2. NST (non-stress test) reactive   3. Decreased fetal movements in third trimester, single or unspecified fetus    Discharge home Follow up in OB office in 2 days as scheduled FMCs  Allergies as of 03/12/2018      Reactions   Amoxicillin Hives, Swelling   Swelling to throat Has patient had a PCN reaction causing immediate rash, facial/tongue/throat swelling, SOB or lightheadedness with hypotension: Yes Has patient had a PCN reaction causing severe rash involving mucus membranes or skin necrosis: No Has patient had a PCN reaction that required hospitalization: No Has patient had a PCN reaction occurring within the last 10 years:  No If all of the above answers are "NO", then may proceed with Cephalosporin use.   Latex Rash      Medication List    TAKE these medications   PRENATAL VITAMIN PO Take 1 tablet by mouth daily.      Donette Larry, CNM 03/12/2018, 5:49 PM

## 2018-03-15 ENCOUNTER — Inpatient Hospital Stay (HOSPITAL_COMMUNITY): Payer: Medicaid Other | Admitting: Anesthesiology

## 2018-03-15 ENCOUNTER — Encounter (HOSPITAL_COMMUNITY): Payer: Self-pay | Admitting: *Deleted

## 2018-03-15 ENCOUNTER — Inpatient Hospital Stay (HOSPITAL_COMMUNITY)
Admission: AD | Admit: 2018-03-15 | Discharge: 2018-03-17 | DRG: 807 | Disposition: A | Discharge status: Home / Self Care | Payer: Medicaid Other | Attending: Obstetrics | Admitting: Obstetrics

## 2018-03-15 DIAGNOSIS — Z3483 Encounter for supervision of other normal pregnancy, third trimester: Secondary | ICD-10-CM | POA: Diagnosis not present

## 2018-03-15 DIAGNOSIS — O289 Unspecified abnormal findings on antenatal screening of mother: Secondary | ICD-10-CM

## 2018-03-15 DIAGNOSIS — Z3A40 40 weeks gestation of pregnancy: Secondary | ICD-10-CM | POA: Diagnosis not present

## 2018-03-15 LAB — TYPE AND SCREEN
ABO/RH(D): A POS
ANTIBODY SCREEN: NEGATIVE

## 2018-03-15 LAB — CBC
HCT: 31.9 % — ABNORMAL LOW (ref 36.0–46.0)
Hemoglobin: 10.8 g/dL — ABNORMAL LOW (ref 12.0–15.0)
MCH: 31.2 pg (ref 26.0–34.0)
MCHC: 33.9 g/dL (ref 30.0–36.0)
MCV: 92.2 fL (ref 78.0–100.0)
PLATELETS: 188 10*3/uL (ref 150–400)
RBC: 3.46 MIL/uL — ABNORMAL LOW (ref 3.87–5.11)
RDW: 13.4 % (ref 11.5–15.5)
WBC: 9.3 10*3/uL (ref 4.0–10.5)

## 2018-03-15 MED ORDER — EPHEDRINE 5 MG/ML INJ
10.0000 mg | INTRAVENOUS | Status: DC | PRN
  Filled 2018-03-15: qty 2

## 2018-03-15 MED ORDER — ONDANSETRON HCL 4 MG PO TABS: 4.0000 mg | ORAL_TABLET | ORAL | Status: DC | PRN

## 2018-03-15 MED ORDER — OXYCODONE-ACETAMINOPHEN 5-325 MG PO TABS: 2.0000 | ORAL_TABLET | ORAL | Status: DC | PRN

## 2018-03-15 MED ORDER — LACTATED RINGERS IV SOLN
INTRAVENOUS | Status: DC
  Administered 2018-03-15: 04:00:00 via INTRAVENOUS

## 2018-03-15 MED ORDER — DIBUCAINE 1 % RE OINT: 1.0000 "application " | TOPICAL_OINTMENT | RECTAL | Status: DC | PRN

## 2018-03-15 MED ORDER — EPHEDRINE 5 MG/ML INJ: 10.0000 mg | INTRAVENOUS | Status: DC | PRN

## 2018-03-15 MED ORDER — OXYCODONE HCL 5 MG PO TABS: 10.0000 mg | ORAL_TABLET | ORAL | Status: DC | PRN

## 2018-03-15 MED ORDER — LIDOCAINE HCL (PF) 1 % IJ SOLN
INTRAMUSCULAR | Status: DC | PRN
  Administered 2018-03-15: 5 mL via EPIDURAL

## 2018-03-15 MED ORDER — PHENYLEPHRINE 40 MCG/ML (10ML) SYRINGE FOR IV PUSH (FOR BLOOD PRESSURE SUPPORT)
80.0000 ug | PREFILLED_SYRINGE | INTRAVENOUS | Status: DC | PRN
  Filled 2018-03-15: qty 5

## 2018-03-15 MED ORDER — TETANUS-DIPHTH-ACELL PERTUSSIS 5-2.5-18.5 LF-MCG/0.5 IM SUSP: 0.5000 mL | Freq: Once | INTRAMUSCULAR | Status: DC

## 2018-03-15 MED ORDER — COCONUT OIL OIL
1.0000 "application " | TOPICAL_OIL | Status: DC | PRN
  Administered 2018-03-15: 1 via TOPICAL
  Filled 2018-03-15: qty 120

## 2018-03-15 MED ORDER — PHENYLEPHRINE 40 MCG/ML (10ML) SYRINGE FOR IV PUSH (FOR BLOOD PRESSURE SUPPORT): 80.0000 ug | PREFILLED_SYRINGE | INTRAVENOUS | Status: DC | PRN

## 2018-03-15 MED ORDER — ACETAMINOPHEN 325 MG PO TABS
650.0000 mg | ORAL_TABLET | ORAL | Status: DC | PRN
  Administered 2018-03-15 – 2018-03-16 (×2): 650 mg via ORAL
  Filled 2018-03-15 (×2): qty 2

## 2018-03-15 MED ORDER — OXYTOCIN 40 UNITS IN LACTATED RINGERS INFUSION - SIMPLE MED
2.5000 [IU]/h | INTRAVENOUS | Status: DC
  Filled 2018-03-15: qty 1000

## 2018-03-15 MED ORDER — LACTATED RINGERS IV SOLN: 500.0000 mL | Freq: Once | INTRAVENOUS | Status: DC

## 2018-03-15 MED ORDER — SENNOSIDES-DOCUSATE SODIUM 8.6-50 MG PO TABS
2.0000 | ORAL_TABLET | ORAL | Status: DC
  Administered 2018-03-15 – 2018-03-17 (×2): 2 via ORAL
  Filled 2018-03-15 (×2): qty 2

## 2018-03-15 MED ORDER — ONDANSETRON HCL 4 MG/2ML IJ SOLN: 4.0000 mg | INTRAMUSCULAR | Status: DC | PRN

## 2018-03-15 MED ORDER — FLEET ENEMA 7-19 GM/118ML RE ENEM: 1.0000 | ENEMA | RECTAL | Status: DC | PRN

## 2018-03-15 MED ORDER — OXYCODONE-ACETAMINOPHEN 5-325 MG PO TABS: 1.0000 | ORAL_TABLET | ORAL | Status: DC | PRN

## 2018-03-15 MED ORDER — FLUCONAZOLE 150 MG PO TABS
150.0000 mg | ORAL_TABLET | Freq: Once | ORAL | Status: AC
  Administered 2018-03-15: 150 mg via ORAL
  Filled 2018-03-15: qty 1

## 2018-03-15 MED ORDER — FENTANYL 2.5 MCG/ML BUPIVACAINE 1/10 % EPIDURAL INFUSION (WH - ANES)
INTRAMUSCULAR | Status: AC
  Filled 2018-03-15: qty 100

## 2018-03-15 MED ORDER — OXYTOCIN BOLUS FROM INFUSION
500.0000 mL | Freq: Once | INTRAVENOUS | Status: AC
  Administered 2018-03-15: 500 mL via INTRAVENOUS

## 2018-03-15 MED ORDER — DIPHENHYDRAMINE HCL 25 MG PO CAPS: 25.0000 mg | ORAL_CAPSULE | Freq: Four times a day (QID) | ORAL | Status: DC | PRN

## 2018-03-15 MED ORDER — ACETAMINOPHEN 325 MG PO TABS: 650.0000 mg | ORAL_TABLET | ORAL | Status: DC | PRN

## 2018-03-15 MED ORDER — PRENATAL MULTIVITAMIN CH
1.0000 | ORAL_TABLET | Freq: Every day | ORAL | Status: DC
  Filled 2018-03-15 (×2): qty 1

## 2018-03-15 MED ORDER — WITCH HAZEL-GLYCERIN EX PADS: 1.0000 "application " | MEDICATED_PAD | CUTANEOUS | Status: DC | PRN

## 2018-03-15 MED ORDER — OXYCODONE HCL 5 MG PO TABS: 5.0000 mg | ORAL_TABLET | ORAL | Status: DC | PRN

## 2018-03-15 MED ORDER — PHENYLEPHRINE 40 MCG/ML (10ML) SYRINGE FOR IV PUSH (FOR BLOOD PRESSURE SUPPORT)
PREFILLED_SYRINGE | INTRAVENOUS | Status: AC
  Filled 2018-03-15: qty 20

## 2018-03-15 MED ORDER — IBUPROFEN 600 MG PO TABS
600.0000 mg | ORAL_TABLET | Freq: Four times a day (QID) | ORAL | Status: DC
  Administered 2018-03-15 – 2018-03-17 (×8): 600 mg via ORAL
  Filled 2018-03-15 (×8): qty 1

## 2018-03-15 MED ORDER — BENZOCAINE-MENTHOL 20-0.5 % EX AERO
1.0000 "application " | INHALATION_SPRAY | CUTANEOUS | Status: DC | PRN
  Administered 2018-03-16: 1 via TOPICAL
  Filled 2018-03-15: qty 56

## 2018-03-15 MED ORDER — LACTATED RINGERS IV SOLN: 500.0000 mL | INTRAVENOUS | Status: DC | PRN

## 2018-03-15 MED ORDER — LIDOCAINE HCL (PF) 1 % IJ SOLN
30.0000 mL | INTRAMUSCULAR | Status: AC | PRN
  Administered 2018-03-15: 30 mL via SUBCUTANEOUS
  Filled 2018-03-15: qty 30

## 2018-03-15 MED ORDER — DIPHENHYDRAMINE HCL 50 MG/ML IJ SOLN: 12.5000 mg | INTRAMUSCULAR | Status: DC | PRN

## 2018-03-15 MED ORDER — SOD CITRATE-CITRIC ACID 500-334 MG/5ML PO SOLN: 30.0000 mL | ORAL | Status: DC | PRN

## 2018-03-15 MED ORDER — SIMETHICONE 80 MG PO CHEW: 80.0000 mg | CHEWABLE_TABLET | ORAL | Status: DC | PRN

## 2018-03-15 MED ORDER — FENTANYL 2.5 MCG/ML BUPIVACAINE 1/10 % EPIDURAL INFUSION (WH - ANES)
14.0000 mL/h | INTRAMUSCULAR | Status: DC | PRN
  Administered 2018-03-15: 14 mL/h via EPIDURAL

## 2018-03-15 MED ORDER — ONDANSETRON HCL 4 MG/2ML IJ SOLN: 4.0000 mg | Freq: Four times a day (QID) | INTRAMUSCULAR | Status: DC | PRN

## 2018-03-15 NOTE — Lactation Note (Signed)
This note was copied from a baby's chart. Lactation Consultation Note  Patient Name: Girl Bonnell PublicBritney Knight XBJYN'WToday's Date: 03/15/2018 Reason for consult: Follow-up assessment;Term  LC came back to help mom latched baby on the challenging breast (left). Mom had baby swaddled and nursing off the right breast already when entering the room, but she complained about soreness because she's only been feeding baby from the right breast. Reviewed treatment for sore nipples and asked RN Abby to bring her some coconut oil, explained to mom how to use it.   Offered assistance with latch, mom agreed to latch baby to the left breast. Tried on cradle position (preferred position for mom) and baby didn't latch. Tried football and baby was able to latch with very little difficulty, multiple swallows were heard upon applying breast compressions. Baby still nursing when exiting the room. Asked mom to call if she needs any assistance and encouraged the feedings STS.   Maternal Data Formula Feeding for Exclusion: No Has patient been taught Hand Expression?: Yes Does the patient have breastfeeding experience prior to this delivery?: Yes  Feeding Feeding Type: Breast Fed Length of feed: 5 min(baby still nursing when exiting the room)  LATCH Score Latch: Grasps breast easily, tongue down, lips flanged, rhythmical sucking.  Audible Swallowing: Spontaneous and intermittent(with breast compressions, on right breast)  Type of Nipple: Everted at rest and after stimulation  Comfort (Breast/Nipple): Filling, red/small blisters or bruises, mild/mod discomfort(on left breast, not the right one)  Hold (Positioning): Assistance needed to correctly position infant at breast and maintain latch.  LATCH Score: 8  Interventions Interventions: Breast feeding basics reviewed;Assisted with latch;Skin to skin;Breast massage;Breast compression;Adjust position;Support pillows;Position options;Coconut oil  Lactation Tools  Discussed/Used Tools: Coconut oil WIC Program: No   Consult Status Consult Status: Follow-up Date: 03/16/18 Follow-up type: In-patient    Aureliano Oshields Venetia ConstableS Columbia Pandey 03/15/2018, 6:36 PM

## 2018-03-15 NOTE — Lactation Note (Signed)
This note was copied from a baby's chart. Lactation Consultation Note  Patient Name: Christine Woodard HYQMV'HToday's Date: 03/15/2018 Reason for consult: Initial assessment  9 hours old FT female who is being exclusively BF by her mother, she's a P2, but not very experienced BF. She was able to provide breastmilk to her first child for 4 months, but she exclusively pump and bottle, first baby never latched on, mom didn't repot any issues with tongue or lip tie, just that first baby couldn't latch. She has a DEBP that she brought to the hospital.  Baby was already nursing when entering the room, mom had her in the typical cradle hold with a somehow shallow latch (left breast). Mom had baby swaddled, advised her to try STS on posterior feedings. Showed mom how to do the cross cradle hold instead and she acknowledge the difference about the depth of the latch. Baby fell asleep shortly after, but was able to complete a 20 minutes feeding with some swallows heard when doing breast compressions.  Per mom feedings at the breast are comfortable and both of her nipples looked intact upon examination, but she reported that the right breast is the challenging one, baby only latches to the left breast so far. Asked mom to call for latch assistance the next time baby is ready to feed.  Encouraged mom to feed baby STS 8-12 times/24 hours or sooner if feeding cues are present. Reviewed BF brochure, BF resources and feeding diary, mom is aware of LC services and will call PRN. [facility].  Maternal Data Formula Feeding for Exclusion: No Has patient been taught Hand Expression?: Yes Does the patient have breastfeeding experience prior to this delivery?: Yes  Feeding Feeding Type: Breast Fed Length of feed: 20 min  LATCH Score Latch: Grasps breast easily, tongue down, lips flanged, rhythmical sucking.  Audible Swallowing: A few with stimulation(with breast compressions, multiple let  downs)  Type of Nipple: Everted at rest and after stimulation  Comfort (Breast/Nipple): Soft / non-tender  Hold (Positioning): Assistance needed to correctly position infant at breast and maintain latch.  LATCH Score: 8  Interventions Interventions: Breast feeding basics reviewed;Assisted with latch;Breast massage;Breast compression;Adjust position;Support pillows  Lactation Tools Discussed/Used WIC Program: No   Consult Status Consult Status: Follow-up Date: 03/16/18 Follow-up type: In-patient    Sharah Finnell Venetia ConstableS Timotheus Salm 03/15/2018, 4:04 PM

## 2018-03-15 NOTE — Anesthesia Procedure Notes (Signed)
Epidural Patient location during procedure: OB Start time: 03/15/2018 4:34 AM End time: 03/15/2018 4:40 AM  Staffing Anesthesiologist: Shelton SilvasHollis, Swayzee Wadley D, MD Performed: anesthesiologist   Preanesthetic Checklist Completed: patient identified, site marked, surgical consent, pre-op evaluation, timeout performed, IV checked, risks and benefits discussed and monitors and equipment checked  Epidural Patient position: sitting Prep: ChloraPrep Patient monitoring: heart rate, continuous pulse ox and blood pressure Approach: midline Location: L3-L4 Injection technique: LOR saline  Needle:  Needle type: Tuohy  Needle gauge: 17 G Needle length: 9 cm Catheter type: closed end flexible Catheter size: 20 Guage Test dose: negative and 1.5% lidocaine  Assessment Events: blood not aspirated, injection not painful, no injection resistance and no paresthesia  Additional Notes LOR @ 5.5  Patient identified. Risks/Benefits/Options discussed with patient including but not limited to bleeding, infection, nerve damage, paralysis, failed block, incomplete pain control, headache, blood pressure changes, nausea, vomiting, reactions to medications, itching and postpartum back pain. Confirmed with bedside nurse the patient's most recent platelet count. Confirmed with patient that they are not currently taking any anticoagulation, have any bleeding history or any family history of bleeding disorders. Patient expressed understanding and wished to proceed. All questions were answered. Sterile technique was used throughout the entire procedure. Please see nursing notes for vital signs. Test dose was given through epidural catheter and negative prior to continuing to dose epidural or start infusion. Warning signs of high block given to the patient including shortness of breath, tingling/numbness in hands, complete motor block, or any concerning symptoms with instructions to call for help. Patient was given instructions on  fall risk and not to get out of bed. All questions and concerns addressed with instructions to call with any issues or inadequate analgesia.    Reason for block:procedure for pain

## 2018-03-15 NOTE — MAU Note (Signed)
Pt presents to MAU c/o ctx every . Pt denies LOF or bleeding. +FM.

## 2018-03-15 NOTE — H&P (Signed)
27 y.o. [id number] @ 2047w0d presents with painful contractions.  Otherwise has good fetal movement and no bleeding.  She has a history of eclampsia with her first pregnancy and has been on ASA 81mg .   Past Medical History:  Diagnosis Date  . Back pain   . Eclampsia   . Headache(784.0)   . Seizures (HCC)    during pregnancy   History reviewed. No pertinent surgical history.  OB History  Gravida Para Term Preterm AB Living  2 1 0 1 0 1  SAB TAB Ectopic Multiple Live Births  0 0 0 0 1    # Outcome Date GA Lbr Len/2nd Weight Sex Delivery Anes PTL Lv  2 Current           1 Preterm 08/11/12 4324w3d 01:45 / 00:34 2.625 kg (5 lb 12.6 oz) M Vag-Vacuum EPI  LIV     Birth Comments: none    Social History   Socioeconomic History  . Marital status: Single    Spouse name: Not on file  . Number of children: Not on file  . Years of education: Not on file  . Highest education level: Not on file  Occupational History  . Not on file  Social Needs  . Financial resource strain: Not on file  . Food insecurity:    Worry: Not on file    Inability: Not on file  . Transportation needs:    Medical: Not on file    Non-medical: Not on file  Tobacco Use  . Smoking status: Never Smoker  . Smokeless tobacco: Never Used  Substance and Sexual Activity  . Alcohol use: No  . Drug use: No  . Sexual activity: Yes    Birth control/protection: None   Amoxicillin and Latex    Prenatal Transfer Tool  Maternal Diabetes: No Genetic Screening: Abnormal:  Results: Other:no results from NIPS--declined further testing Maternal Ultrasounds/Referrals: Normal Fetal Ultrasounds or other Referrals:  None Maternal Substance Abuse:  No Significant Maternal Medications:  None Significant Maternal Lab Results: Lab values include: Group B Strep negative  ABO, Rh: --/--/A POS (07/04 0355) Antibody: NEG (07/04 0355) Rubella:  Immune RPR:   NR HBsAg:   Neg HIV:   Neg GBS:   Neg      Vitals:   03/15/18 0530  03/15/18 0600  BP: 116/85 (!) 116/94  Pulse: 78 88  SpO2: 99%      General:  NAD Abdomen:  soft, gravid, EFW 6.5# Ex:  trace edema SVE:  10/10/+1 FHTs:  Cat 1 Toco:  q3 min   A/P   26 y.o. G2P0101 4947w0d presents with labor Admit Epidural  FSR/ vtx/ GBS neg  Arika Mainer GEFFEL Draycen Leichter

## 2018-03-15 NOTE — Anesthesia Preprocedure Evaluation (Signed)
Anesthesia Evaluation  Patient identified by MRN, date of birth, ID band Patient awake    Reviewed: Allergy & Precautions, Patient's Chart, lab work & pertinent test results  Airway Mallampati: II       Dental no notable dental hx.    Pulmonary    Pulmonary exam normal        Cardiovascular negative cardio ROS Normal cardiovascular exam     Neuro/Psych  Headaches, Seizures -,     GI/Hepatic Neg liver ROS,   Endo/Other    Renal/GU      Musculoskeletal   Abdominal   Peds  Hematology negative hematology ROS (+)   Anesthesia Other Findings   Reproductive/Obstetrics (+) Pregnancy                             Anesthesia Physical Anesthesia Plan  ASA: III  Anesthesia Plan: Epidural   Post-op Pain Management:    Induction:   PONV Risk Score and Plan:   Airway Management Planned: Natural Airway  Additional Equipment: None  Intra-op Plan:   Post-operative Plan:   Informed Consent: I have reviewed the patients History and Physical, chart, labs and discussed the procedure including the risks, benefits and alternatives for the proposed anesthesia with the patient or authorized representative who has indicated his/her understanding and acceptance.     Plan Discussed with:   Anesthesia Plan Comments: (Lab Results      Component                Value               Date                      WBC                      9.3                 03/15/2018                HGB                      10.8 (L)            03/15/2018                HCT                      31.9 (L)            03/15/2018                MCV                      92.2                03/15/2018                PLT                      188                 03/15/2018           )        Anesthesia Quick Evaluation

## 2018-03-15 NOTE — Anesthesia Postprocedure Evaluation (Signed)
Anesthesia Post Note  Patient: Christine Woodard  Procedure(s) Performed: AN AD HOC LABOR EPIDURAL     Patient location during evaluation: Mother Baby Anesthesia Type: Epidural Level of consciousness: awake and alert Pain management: pain level controlled Vital Signs Assessment: post-procedure vital signs reviewed and stable Respiratory status: spontaneous breathing, nonlabored ventilation and respiratory function stable Cardiovascular status: stable Postop Assessment: no headache, no backache, epidural receding, able to ambulate, adequate PO intake, no apparent nausea or vomiting and patient able to bend at knees Anesthetic complications: no    Last Vitals:  Vitals:   03/15/18 0920 03/15/18 1320  BP: 119/75 114/67  Pulse: 73 85  Resp: 18 18  Temp: 36.7 C 36.8 C  SpO2:      Last Pain:  Vitals:   03/15/18 1320  TempSrc: Oral  PainSc: 2    Pain Goal: Patients Stated Pain Goal: 0 (03/15/18 0336)               Laban EmperorMalinova,Kobie Whidby Hristova

## 2018-03-16 LAB — CBC
HCT: 28.1 % — ABNORMAL LOW (ref 36.0–46.0)
Hemoglobin: 9.3 g/dL — ABNORMAL LOW (ref 12.0–15.0)
MCH: 31.2 pg (ref 26.0–34.0)
MCHC: 33.1 g/dL (ref 30.0–36.0)
MCV: 94.3 fL (ref 78.0–100.0)
PLATELETS: 154 10*3/uL (ref 150–400)
RBC: 2.98 MIL/uL — ABNORMAL LOW (ref 3.87–5.11)
RDW: 13.7 % (ref 11.5–15.5)
WBC: 9.8 10*3/uL (ref 4.0–10.5)

## 2018-03-16 NOTE — Lactation Note (Addendum)
This note was copied from a baby's chart. Lactation Consultation Note: Mom reports this baby is latching well. Reports left nipple is a little sore- looks intact.. Has coconut oil. Encouraged EBM to nipples also. Baby having hearing screen done. Reports this baby is very different than her first. First one would not latch. Had stool at birth but none since. Several voids. Reviewed our phone number, OP appointments and BFSG as resources for support after DC. No questions at present. To call prn  Patient Name: Christine Woodard Reason for consult: Follow-up assessment   Maternal Data Formula Feeding for Exclusion: No Has patient been taught Hand Expression?: Yes Does the patient have breastfeeding experience prior to this delivery?: Yes  Feeding Feeding Type: Breast Fed Length of feed: 50 min(mom reports off and on for an hour)  LATCH Score                   Interventions    Lactation Tools Discussed/Used     Consult Status Consult Status: Complete    Pamelia HoitWeeks, Altonio Schwertner D Woodard, 9:43 AM

## 2018-03-16 NOTE — Progress Notes (Signed)
Patient is eating, ambulating, voiding.  Pain control is good.  Vitals:   03/15/18 2138 03/15/18 2315 03/16/18 0700 03/16/18 0704  BP: 132/88 131/86  125/82  Pulse: 78 68  73  Resp: 18   20  Temp: 98.1 F (36.7 C)   97.7 F (36.5 C)  TempSrc: Oral  Oral Oral  SpO2: 99%   99%  Weight:        Fundus firm Perineum without swelling.  Lab Results  Component Value Date   WBC 9.8 03/16/2018   HGB 9.3 (L) 03/16/2018   HCT 28.1 (L) 03/16/2018   MCV 94.3 03/16/2018   PLT 154 03/16/2018    --/--/A POS (07/04 0355)/RI  A/P Post partum day 1.  Routine care.  Expect d/c routine.  Iron.  Castin Donaghue A

## 2018-03-16 NOTE — Discharge Summary (Signed)
Obstetric Discharge Summary Reason for Admission: onset of labor Prenatal Procedures: none Intrapartum Procedures: spontaneous vaginal delivery Postpartum Procedures: none Complications-Operative and Postpartum: labial laceration Hemoglobin  Date Value Ref Range Status  03/16/2018 9.3 (L) 12.0 - 15.0 g/dL Final   HCT  Date Value Ref Range Status  03/16/2018 28.1 (L) 36.0 - 46.0 % Final    Discharge Diagnoses: Term Pregnancy-delivered  Discharge Information: Date: 03/16/2018 Activity: pelvic rest Diet: routine Medications: Ibuprofen and Iron Condition: stable Instructions: refer to practice specific booklet Discharge to: home Follow-up Information    Marlow Baarslark, Dyanna, MD Follow up in 4 week(s).   Specialty:  Obstetrics Contact information: [address] [place] [address] [phone]           Newborn Data: Live born female  Birth Weight: 7 lb 14.1 oz (3575 g) APGAR: 7, 9  Newborn Delivery   Birth date/time:  03/15/2018 06:37:00 Delivery type:  Vaginal, Spontaneous     Home with mother.  Asberry Lascola A 03/16/2018, 8:05 AM

## 2018-03-17 LAB — RPR: RPR Ser Ql: NONREACTIVE

## 2018-03-17 NOTE — Lactation Note (Signed)
This note was copied from a baby's chart. Lactation Consultation Note  Patient Name: Girl Bonnell PublicBritney Knight WUJWJ'XToday's Date: 03/17/2018 Reason for consult: Follow-up assessment Mom reports feedings are going well.  She does have a small crack on the tip of left nipple.  Comfort gels given with instructions.  Discussed milk coming to volume and the prevention and treatment of engorgement.  Lactation outpatient services and support information reviewed and encouraged prn.  Maternal Data    Feeding    LATCH Score                   Interventions    Lactation Tools Discussed/Used Tools: Comfort gels   Consult Status Consult Status: Complete Follow-up type: Call as needed    Huston FoleyMOULDEN, Sahar Ryback S 03/17/2018, 9:46 AM

## 2018-03-17 NOTE — Progress Notes (Signed)
Patient is eating, ambulating, voiding.  Pain control is good.  Vitals:   03/16/18 0704 03/16/18 1439 03/16/18 2200 03/17/18 0631  BP: 125/82 129/89 113/66 119/69  Pulse: 73 75 79 80  Resp: 20 18  16   Temp: 97.7 F (36.5 C) 98.1 F (36.7 C) 98.2 F (36.8 C) 97.8 F (36.6 C)  TempSrc: Oral Oral Oral Oral  SpO2: 99% 99%    Weight:        Fundus firm Perineum without swelling.  Lab Results  Component Value Date   WBC 9.8 03/16/2018   HGB 9.3 (L) 03/16/2018   HCT 28.1 (L) 03/16/2018   MCV 94.3 03/16/2018   PLT 154 03/16/2018    --/--/A POS (07/04 0355)/RI  A/P Post partum day 2.  Routine care.  Expect d/c today.  Iron.    Chelsie Burel A

## 2018-04-13 DIAGNOSIS — Z1389 Encounter for screening for other disorder: Secondary | ICD-10-CM | POA: Diagnosis not present

## 2018-08-29 ENCOUNTER — Other Ambulatory Visit: Payer: Self-pay

## 2019-09-12 ENCOUNTER — Ambulatory Visit
Admission: EM | Admit: 2019-09-12 | Discharge: 2019-09-12 | Disposition: A | Discharge status: Home / Self Care | Payer: Medicaid Other

## 2019-09-12 ENCOUNTER — Emergency Department (HOSPITAL_COMMUNITY): Payer: Medicaid Other

## 2019-09-12 ENCOUNTER — Other Ambulatory Visit: Payer: Self-pay

## 2019-09-12 ENCOUNTER — Encounter (HOSPITAL_COMMUNITY): Payer: Self-pay | Admitting: Emergency Medicine

## 2019-09-12 ENCOUNTER — Emergency Department (HOSPITAL_COMMUNITY)
Admission: EM | Admit: 2019-09-12 | Discharge: 2019-09-12 | Disposition: A | Discharge status: Home / Self Care | Payer: Medicaid Other | Attending: Emergency Medicine | Admitting: Emergency Medicine

## 2019-09-12 DIAGNOSIS — R079 Chest pain, unspecified: Secondary | ICD-10-CM | POA: Insufficient documentation

## 2019-09-12 DIAGNOSIS — Z91040 Latex allergy status: Secondary | ICD-10-CM | POA: Diagnosis not present

## 2019-09-12 DIAGNOSIS — R42 Dizziness and giddiness: Secondary | ICD-10-CM

## 2019-09-12 LAB — CBC
HCT: 35 % — ABNORMAL LOW (ref 36.0–46.0)
Hemoglobin: 11 g/dL — ABNORMAL LOW (ref 12.0–15.0)
MCH: 28 pg (ref 26.0–34.0)
MCHC: 31.4 g/dL (ref 30.0–36.0)
MCV: 89.1 fL (ref 80.0–100.0)
Platelets: 310 10*3/uL (ref 150–400)
RBC: 3.93 MIL/uL (ref 3.87–5.11)
RDW: 13.9 % (ref 11.5–15.5)
WBC: 10.1 10*3/uL (ref 4.0–10.5)
nRBC: 0 % (ref 0.0–0.2)

## 2019-09-12 LAB — BASIC METABOLIC PANEL
Anion gap: 11 (ref 5–15)
BUN: 11 mg/dL (ref 6–20)
CO2: 23 mmol/L (ref 22–32)
Calcium: 9 mg/dL (ref 8.9–10.3)
Chloride: 104 mmol/L (ref 98–111)
Creatinine, Ser: 0.53 mg/dL (ref 0.44–1.00)
GFR calc Af Amer: >60 mL/min (ref >60)
GFR calc non Af Amer: >60 mL/min (ref >60)
Glucose, Bld: 123 mg/dL — ABNORMAL HIGH (ref 70–99)
Potassium: 3.7 mmol/L (ref 3.5–5.1)
Sodium: 138 mmol/L (ref 135–145)

## 2019-09-12 LAB — TROPONIN I (HIGH SENSITIVITY): Troponin I (High Sensitivity): <2 ng/L (ref <18)

## 2019-09-12 LAB — POCT PREGNANCY, URINE: Preg Test, Ur: NEGATIVE

## 2019-09-12 LAB — D-DIMER, QUANTITATIVE (NOT AT ARMC): D-Dimer, Quant: 0.35 ug/mL-FEU (ref 0.00–0.50)

## 2019-09-12 MED ORDER — SODIUM CHLORIDE 0.9% FLUSH: 3.0000 mL | Freq: Once | INTRAVENOUS | Status: DC

## 2019-09-12 NOTE — ED Notes (Signed)
Patient transported to X-ray 

## 2019-09-12 NOTE — ED Triage Notes (Signed)
Pt presents with c/o chest pain, has had symptoms for about a month but began having left arm pain ;ast night, pain id in upper left chest and shoulder area, pt also described being light headed

## 2019-09-12 NOTE — Discharge Instructions (Signed)
Your testing today shows no signs of heart attack, your blood work and your x-ray were both normal, your EKG showed no signs of heart problems either.  Please take an anti-inflammatory such as naproxen twice a day as needed for pain, seek medical exam for severe or worsening symptoms and have your family doctor recheck you in 3 days

## 2019-09-12 NOTE — ED Triage Notes (Signed)
Patient was seen at [facility] for chest left sided chest pain that radiates down to left arm. Patient states feeling light headed last night just before chest pain. Sates chest pain comes and goes.

## 2019-09-12 NOTE — ED Provider Notes (Signed)
[facility]ENCY DEPARTMENT Provider Note   CSN: [id number] Arrival date & time: 09/12/19  1448     History Chief Complaint  Patient presents with  . Chest Pain    Christine Woodard is a 28 y.o. female.  HPI    This patient is a 28 year old female, she has no prior cardiac or pulmonary history.  She is otherwise healthy complaining only of some headaches and weight gain over the last year since having her last child.  She reports that she does not smoke or drink, she usually does not have any chest pain however last night started having some pain in the left side of her chest which has been rather persistent in fact it has not gone away at all since it started.  This pain seems to get worse when she uses her left arm and does radiate down the arm.  She is not short of breath with this and has no fevers or chills, nausea or vomiting or has not become diaphoretic.  There is no swelling of the legs, no recent travel trauma immobilization or recent surgery and she does not use birth control pills.  She states that she has had rather constant bleeding with menstrual cycles rather ever since childbirth.  She has never had cardiac testing per se, she does have a father who had a coronary bypass graft in his 4640s and a grandfather who has had multiple coronary events and blood clots.  The patient herself has never had blood clots.  The patient was initially seen at the urgent care and was sent to the emergency department because of the chest pain with the associated radiation to the arm and the lightheadedness.  She states she still feels the symptom, the pain radiates primarily to the left arm and ends in the volar and radial surface of the left forearm  Past Medical History:  Diagnosis Date  . Back pain   . Eclampsia   . Headache(784.0)   . Seizures (HCC)    during pregnancy    Patient Active Problem List   Diagnosis Date Noted  . Normal labor 03/15/2018  . Abnormal antenatal test  10/19/2017  . [redacted] weeks gestation of pregnancy     History reviewed. No pertinent surgical history.   OB History    Gravida  2   Para  2   Term  1   Preterm  1   AB  0   Living  2     SAB  0   TAB  0   Ectopic  0   Multiple  0   Live Births  2           Family History  Problem Relation Age of Onset  . Diabetes Father   . Diabetes Paternal Grandmother   . Diabetes Paternal Grandfather     Social History   Tobacco Use  . Smoking status: Never Smoker  . Smokeless tobacco: Never Used  Substance Use Topics  . Alcohol use: No  . Drug use: No    Home Medications Prior to Admission medications   Not on File    Allergies    Amoxicillin and Latex  Review of Systems   Review of Systems  All other systems reviewed and are negative.   Physical Exam Updated Vital Signs BP (!) 158/102 (BP Location: Right Arm)   Pulse 83   Temp 98.7 F (37.1 C) (Oral)   Resp 18   SpO2 99%  Physical Exam Vitals and nursing note reviewed.  Constitutional:      General: She is not in acute distress.    Appearance: She is well-developed.     Comments: The patient is obese but in no distress  HENT:     Head: Normocephalic and atraumatic.     Mouth/Throat:     Pharynx: No oropharyngeal exudate.  Eyes:     General: No scleral icterus.       Right eye: No discharge.        Left eye: No discharge.     Conjunctiva/sclera: Conjunctivae normal.     Pupils: Pupils are equal, round, and reactive to light.  Neck:     Thyroid: No thyromegaly.     Vascular: No JVD.  Cardiovascular:     Rate and Rhythm: Normal rate and regular rhythm.     Heart sounds: Normal heart sounds. No murmur. No friction rub. No gallop.   Pulmonary:     Effort: Pulmonary effort is normal. No respiratory distress.     Breath sounds: Normal breath sounds. No wheezing or rales.  Abdominal:     General: Bowel sounds are normal. There is no distension.     Palpations: Abdomen is soft. There is no  mass.     Tenderness: There is no abdominal tenderness.  Musculoskeletal:        General: Normal range of motion.     Cervical back: Normal range of motion and neck supple.     Right lower leg: Tenderness present.     Comments: There is tenderness to palpation over the left upper chest wall, chaperone present for exam.  She has pain in the arm and the chest with range of motion of the left arm  Lymphadenopathy:     Cervical: No cervical adenopathy.  Skin:    General: Skin is warm and dry.     Findings: No erythema or rash.  Neurological:     Mental Status: She is alert.     Coordination: Coordination normal.  Psychiatric:        Behavior: Behavior normal.     ED Results / Procedures / Treatments   Labs (all labs ordered are listed, but only abnormal results are displayed) Labs Reviewed  CBC - Abnormal; Notable for the following components:      Result Value   Hemoglobin 11.0 (*)    HCT 35.0 (*)    All other components within normal limits  BASIC METABOLIC PANEL  POC URINE PREG, ED  POCT PREGNANCY, URINE  TROPONIN I (HIGH SENSITIVITY)  TROPONIN I (HIGH SENSITIVITY)    EKG EKG Interpretation  Date/Time:  Thursday September 12 2019 14:58:03 EST Ventricular Rate:  89 PR Interval:  146 QRS Duration: 76 QT Interval:  352 QTC Calculation: 428 R Axis:   31 Text Interpretation: Normal sinus rhythm Cannot rule out Anterior infarct , age undetermined Abnormal ECG since last tracing no significant change Confirmed by Eber Hong ([id number]) on 09/12/2019 5:52:34 PM   Radiology DG Chest 2 View  Result Date: 09/12/2019 CLINICAL DATA:  Chest pain EXAM: CHEST - 2 VIEW COMPARISON:  02/10/2015 FINDINGS: The heart size and mediastinal contours are within normal limits. Both lungs are clear. The visualized skeletal structures are unremarkable. IMPRESSION: No active cardiopulmonary disease. Electronically Signed   By: Duanne Guess D.O.   On: 09/12/2019 15:16     Procedures Procedures (including critical care time)  Medications Ordered in ED Medications  sodium chloride flush (NS) 0.9 %  injection 3 mL (has no administration in time range)    ED Course  I have reviewed the triage vital signs and the nursing notes.  Pertinent labs & imaging results that were available during my care of the patient were reviewed by me and considered in my medical decision making (see chart for details).  Clinical Course as of Sep 12 1943  Thu Sep 12, 2019  1943 The x-ray is negative for acute infiltrate or pneumothorax and the lab work is totally normal without any signs of elevation in troponin.  The patient is very stable for discharge, doubt cardiac process   [BM]    Clinical Course User Index [BM] Noemi Chapel, MD   MDM Rules/Calculators/A&P                      This patient's exam is unremarkable, her heart rate is around 100 however she reduces down to 83 when she is by herself in the room.  Her blood pressure is slightly elevated, oxygen is normal and she is afebrile.  I have looked at her EKG which is totally normal, there is no signs of acute ischemia, no signs of right heart strain or pulmonary embolism either.  Given her weight gain and her persistent pain it would be reasonable to check a D-dimer and a troponin.  The patient is agreeable to this plan, this may just be musculoskeletal.  She is agreeable to the work-up  JAMIAH HOMEYER was evaluated in Emergency Department on 09/12/2019 for the symptoms described in the history of present illness. She was evaluated in the context of the global COVID-19 pandemic, which necessitated consideration that the patient might be at risk for infection with the SARS-CoV-2 virus that causes COVID-19. Institutional protocols and algorithms that pertain to the evaluation of patients at risk for COVID-19 are in a state of rapid change based on information released by regulatory bodies including the CDC and federal  and state organizations. These policies and algorithms were followed during the patient's care in the ED.   Final Clinical Impression(s) / ED Diagnoses Final diagnoses:  None    Rx / DC Orders ED Discharge Orders    None       Noemi Chapel, MD 09/12/19 1944

## 2019-09-12 NOTE — ED Provider Notes (Signed)
[facility] CARE CENTER   [id number] 09/12/19 Arrival Time: 1218   CC: CHEST PAIN  SUBJECTIVE:  Christine Woodard is a 28 y.o. female who presents with complaint of abrupt chest pain that began last night.  Denies a precipitating event, trauma, recent lower respiratory tract, or strenuous upper body activities.  Localizes chest pain to the left side of chest and radiates into the left arm.  Describes as improving, that is intermittently sharp (lasts few minutes at a time), but constantly numb, tightn, and weak in character.  Rates pain as 4-5/10.   Has NOT tried OTC medications without relief. Denies aggravating factors.  Reports previous symptoms in the past, but unsure of what she was diagnosed with or what she was treated with.  Complains of associated lightheadedness.  Denies fever, chills, dizziness, palpitations, tachycardia, SOB, nausea, vomiting, abdominal pain, changes in bowel or bladder habits, diaphoresis,  peripheral edema, or anxiety.    Denies SOB, calf pain or swelling, recent long travel, recent surgery, pregnancy, malignancy, tobacco use, hormone use, or previous blood clot  Father had bypass; maternal grandfather with "heart issues" and "blood clots."   ROS: As per HPI.  All other pertinent ROS negative.    Past Medical History:  Diagnosis Date  . Back pain   . Eclampsia   . Headache(784.0)   . Seizures (HCC)    during pregnancy   No past surgical history on file. Allergies  Allergen Reactions  . Amoxicillin Hives and Swelling    Swelling to throat Has patient had a PCN reaction causing immediate rash, facial/tongue/throat swelling, SOB or lightheadedness with hypotension: Yes Has patient had a PCN reaction causing severe rash involving mucus membranes or skin necrosis: No Has patient had a PCN reaction that required hospitalization: No Has patient had a PCN reaction occurring within the last 10 years: No If all of the above answers are "NO", then may proceed with  Cephalosporin use.   . Latex Rash   No current facility-administered medications on file prior to encounter.   No current outpatient medications on file prior to encounter.   Social History   Socioeconomic History  . Marital status: Single    Spouse name: Not on file  . Number of children: Not on file  . Years of education: Not on file  . Highest education level: Not on file  Occupational History  . Not on file  Tobacco Use  . Smoking status: Never Smoker  . Smokeless tobacco: Never Used  Substance and Sexual Activity  . Alcohol use: No  . Drug use: No  . Sexual activity: Yes    Birth control/protection: None  Other Topics Concern  . Not on file  Social History Narrative  . Not on file   Social Determinants of Health   Financial Resource Strain:   . Difficulty of Paying Living Expenses: Not on file  Food Insecurity:   . Worried About Programme researcher, broadcasting/film/video in the Last Year: Not on file  . Ran Out of Food in the Last Year: Not on file  Transportation Needs:   . Lack of Transportation (Medical): Not on file  . Lack of Transportation (Non-Medical): Not on file  Physical Activity:   . Days of Exercise per Week: Not on file  . Minutes of Exercise per Session: Not on file  Stress:   . Feeling of Stress : Not on file  Social Connections:   . Frequency of Communication with Friends and Family: Not on file  .  Frequency of Social Gatherings with Friends and Family: Not on file  . Attends Religious Services: Not on file  . Active Member of Clubs or Organizations: Not on file  . Attends Archivist Meetings: Not on file  . Marital Status: Not on file  Intimate Partner Violence:   . Fear of Current or Ex-Partner: Not on file  . Emotionally Abused: Not on file  . Physically Abused: Not on file  . Sexually Abused: Not on file   Family History  Problem Relation Age of Onset  . Diabetes Father   . Diabetes Paternal Grandmother   . Diabetes Paternal Grandfather       OBJECTIVE:  Vitals:   09/12/19 1240  BP: (!) 136/98  Pulse: (!) 105  Resp: 18  Temp: 98.7 F (37.1 C)  SpO2: 97%    General appearance: alert; no distress Eyes: PERRLA; EOMI; conjunctiva normal HENT: normocephalic; atraumatic; oropharynx clear Neck: supple; no carotid bruits Lungs: clear to auscultation bilaterally without adventitious breath sounds Heart: regular rate and rhythm.  Clear S1 and S2 without rubs, gallops, or murmur. Chest Wall: No heaves, lifts, or thrills; mildly TTP over LT upper chest Abdomen: soft, mildly TTP over epigastric region; bowel sounds normal; no guarding Extremities: no cyanosis or edema; symmetrical with no gross deformities Skin: warm and dry Psychological: alert and cooperative; normal mood and affect  ECG: Orders placed or performed during the hospital encounter of 02/10/15  . ED EKG  . ED EKG  . EKG 12-Lead  . EKG 12-Lead  . EKG 12-Lead  . EKG 12-Lead    EKG normal sinus rhythm without ST elevations, depressions, or prolonged PR interval.  No narrowing or widening of the QRS complexes.   ASSESSMENT & PLAN:  1. Chest pain, unspecified type   2. Lightheadedness     Unable to rule out cardiac disease or blood clot in urgent care setting.  EKG is normal.  Patient will go by private vehicle to [facility] ED for further evaluation and management    Lestine Box, [place] 09/12/19 1342

## 2019-09-12 NOTE — Discharge Instructions (Addendum)
Unable to rule out cardiac disease or blood clot in urgent care setting.  EKG is normal.  Patient will go by private vehicle to [facility] ED for further evaluation and management

## 2019-09-18 ENCOUNTER — Other Ambulatory Visit: Payer: Self-pay

## 2019-09-18 ENCOUNTER — Ambulatory Visit: Payer: Medicaid Other | Attending: Internal Medicine

## 2019-09-18 DIAGNOSIS — Z20822 Contact with and (suspected) exposure to covid-19: Secondary | ICD-10-CM

## 2019-09-20 LAB — NOVEL CORONAVIRUS, NAA: SARS-CoV-2, NAA: DETECTED — AB

## 2019-11-05 ENCOUNTER — Ambulatory Visit (INDEPENDENT_AMBULATORY_CARE_PROVIDER_SITE_OTHER): Payer: Medicaid Other | Admitting: Adult Health

## 2019-11-05 ENCOUNTER — Encounter: Payer: Self-pay | Admitting: Adult Health

## 2019-11-05 ENCOUNTER — Other Ambulatory Visit (HOSPITAL_COMMUNITY)
Admission: RE | Admit: 2019-11-05 | Discharge: 2019-11-05 | Disposition: A | Discharge status: Home / Self Care | Payer: Medicaid Other | Source: Ambulatory Visit | Attending: Adult Health | Admitting: Adult Health

## 2019-11-05 ENCOUNTER — Other Ambulatory Visit: Payer: Self-pay

## 2019-11-05 VITALS — BP 133/93 | HR 100 | Ht 63.0 in | Wt 264.0 lb

## 2019-11-05 DIAGNOSIS — N939 Abnormal uterine and vaginal bleeding, unspecified: Secondary | ICD-10-CM | POA: Diagnosis not present

## 2019-11-05 DIAGNOSIS — Z3202 Encounter for pregnancy test, result negative: Secondary | ICD-10-CM | POA: Diagnosis not present

## 2019-11-05 DIAGNOSIS — R635 Abnormal weight gain: Secondary | ICD-10-CM

## 2019-11-05 DIAGNOSIS — K649 Unspecified hemorrhoids: Secondary | ICD-10-CM

## 2019-11-05 DIAGNOSIS — Z0000 Encounter for general adult medical examination without abnormal findings: Secondary | ICD-10-CM | POA: Insufficient documentation

## 2019-11-05 DIAGNOSIS — Z01419 Encounter for gynecological examination (general) (routine) without abnormal findings: Secondary | ICD-10-CM | POA: Diagnosis not present

## 2019-11-05 DIAGNOSIS — R030 Elevated blood-pressure reading, without diagnosis of hypertension: Secondary | ICD-10-CM

## 2019-11-05 LAB — POCT URINE PREGNANCY: Preg Test, Ur: NEGATIVE

## 2019-11-05 NOTE — Progress Notes (Signed)
Patient ID: Christine Woodard, female   DOB: [birth year], 29 y.o.   MRN: [id number] History of Present Illness:  Christine Woodard is a 29 year old white female, single, G2P1102 in for a well woman gyn exam and pap.She is a new pt. She is complaining of bleeding on and off for last year, may spot, be light and then heavy,has clots. Has gained weight since delivery and has been trying to lose and can't. Has knot in rectal area.  She had COVID in January.   Current Medications, Allergies, Past Medical History, Past Surgical History, Family History and Social History were reviewed in [name].     Review of Systems: Patient denies any hearing loss, fatigue, blurred vision, shortness of breath,  abdominal pain, problems with bowel movements, urination, or intercourse. No joint pain or mood swings. Has had bleeding on and off since stopping micronor Has migraines Hs had chest pain and seen in ER had negative work up  +weight gain +COVID in January 2021  Has knot in rectal  area and extra skin.  Physical Exam:BP (!) 133/93 (BP Location: Left Arm, Patient Position: Sitting, Cuff Size: Large)   Pulse 100   Ht 5\' 3"  (1.6 m)   Wt 264 lb (119.7 kg)   LMP 11/05/2019   Breastfeeding No   BMI 46.77 kg/m UPT is negative  General:  Well developed, well nourished, no acute distress Skin:  Warm and dry Neck:  Midline trachea, normal thyroid, good ROM, no lymphadenopathy Lungs; Clear to auscultation bilaterally Breast:  No dominant palpable mass, retraction, or nipple discharge Cardiovascular: Regular rate and rhythm Abdomen:  Soft, non tender, no hepatosplenomegaly Pelvic:  External genitalia is normal in appearance, no lesions.  The vagina is normal in appearance, +light period like blood. Urethra has no lesions or masses. The cervix is bulbous,pap with GC/CHL and high risk HPV 16/18 genotyping performed.  Uterus is felt to be normal size, shape, and contour, and mildly  tender.  No adnexal masses or tenderness noted.Bladder is non tender, no masses felt. Has extra skin at rectal area and small hemorrhoid on the right. Extremities/musculoskeletal:  No swelling or varicosities noted, no clubbing or cyanosis Psych:  No mood changes, alert and cooperative,seems happy Fall risk is low PHQ 9 score is 14, denies being suicidal, just stressed over trying to buy a house. Co exam with 11/07/2019 NP   Impression and Plan:  1. Routine medical exam  2. Pregnancy examination or test, negative result   3. Encounter for gynecological examination with Papanicolaou smear of cervix Pap sent Physical in 1 year Pap in 3 if normal   4. Hemorrhoids, unspecified hemorrhoid type Try preparation H or anusol  5. Abnormal uterine bleeding (AUB) Will get Richelle Ito to assess uterus and ovaries in about a week then see me 2-3 days later to check BP and talk birth control options  Will check labs, Check CBC,CMP,TSH and free T4  6. Weight gain   7. Elevated BP without diagnosis of hypertension Recheck in about 2 weeks

## 2019-11-06 LAB — COMPREHENSIVE METABOLIC PANEL
ALT: 21 IU/L (ref 0–32)
AST: 24 IU/L (ref 0–40)
Albumin/Globulin Ratio: 1.5 (ref 1.2–2.2)
Albumin: 4.3 g/dL (ref 3.9–5.0)
Alkaline Phosphatase: 80 IU/L (ref 39–117)
BUN/Creatinine Ratio: 10 (ref 9–23)
BUN: 6 mg/dL (ref 6–20)
Bilirubin Total: <0.2 mg/dL (ref 0.0–1.2)
CO2: 22 mmol/L (ref 20–29)
Calcium: 8.9 mg/dL (ref 8.7–10.2)
Chloride: 102 mmol/L (ref 96–106)
Creatinine, Ser: 0.6 mg/dL (ref 0.57–1.00)
GFR calc Af Amer: 145 mL/min/{1.73_m2} (ref >59)
GFR calc non Af Amer: 126 mL/min/{1.73_m2} (ref >59)
Globulin, Total: 2.9 g/dL (ref 1.5–4.5)
Glucose: 84 mg/dL (ref 65–99)
Potassium: 4.4 mmol/L (ref 3.5–5.2)
Sodium: 140 mmol/L (ref 134–144)
Total Protein: 7.2 g/dL (ref 6.0–8.5)

## 2019-11-06 LAB — T4, FREE: Free T4: 1.27 ng/dL (ref 0.82–1.77)

## 2019-11-06 LAB — CBC
Hematocrit: 33.1 % — ABNORMAL LOW (ref 34.0–46.6)
Hemoglobin: 10.2 g/dL — ABNORMAL LOW (ref 11.1–15.9)
MCH: 25.2 pg — ABNORMAL LOW (ref 26.6–33.0)
MCHC: 30.8 g/dL — ABNORMAL LOW (ref 31.5–35.7)
MCV: 82 fL (ref 79–97)
Platelets: 397 10*3/uL (ref 150–450)
RBC: 4.04 x10E6/uL (ref 3.77–5.28)
RDW: 14.6 % (ref 11.7–15.4)
WBC: 9.5 10*3/uL (ref 3.4–10.8)

## 2019-11-06 LAB — TSH: TSH: 1.22 u[IU]/mL (ref 0.450–4.500)

## 2019-11-13 LAB — CYTOLOGY - PAP
Chlamydia: NEGATIVE
Comment: NEGATIVE
Comment: NEGATIVE
Comment: NEGATIVE
Comment: NEGATIVE
Comment: NORMAL
Diagnosis: NEGATIVE
HPV 16: NEGATIVE
HPV 18 / 45: NEGATIVE
High risk HPV: POSITIVE — AB
Neisseria Gonorrhea: NEGATIVE

## 2019-11-14 ENCOUNTER — Other Ambulatory Visit: Payer: Self-pay

## 2019-11-14 ENCOUNTER — Ambulatory Visit (INDEPENDENT_AMBULATORY_CARE_PROVIDER_SITE_OTHER): Payer: Medicaid Other

## 2019-11-14 DIAGNOSIS — N939 Abnormal uterine and vaginal bleeding, unspecified: Secondary | ICD-10-CM | POA: Diagnosis not present

## 2019-11-14 DIAGNOSIS — R3989 Other symptoms and signs involving the genitourinary system: Secondary | ICD-10-CM | POA: Diagnosis not present

## 2019-11-14 DIAGNOSIS — R9389 Abnormal findings on diagnostic imaging of other specified body structures: Secondary | ICD-10-CM

## 2019-11-14 NOTE — Progress Notes (Signed)
PELVIC TA/TV:homogeneous anteverted uterus,wnl,homogeneous thickened endometrium 25 mm,normal ovaries,no free fluid,no pain during ultrasound,ovaries appear mobile

## 2019-11-15 ENCOUNTER — Telehealth: Payer: Self-pay | Admitting: Adult Health

## 2019-11-15 NOTE — Telephone Encounter (Signed)
t aware [place] has not been read yet

## 2019-11-18 ENCOUNTER — Other Ambulatory Visit: Payer: Self-pay

## 2019-11-18 ENCOUNTER — Encounter: Payer: Self-pay | Admitting: Adult Health

## 2019-11-18 ENCOUNTER — Ambulatory Visit (INDEPENDENT_AMBULATORY_CARE_PROVIDER_SITE_OTHER): Payer: Medicaid Other | Admitting: Adult Health

## 2019-11-18 VITALS — BP 129/93 | HR 89 | Ht 63.0 in | Wt 268.0 lb

## 2019-11-18 DIAGNOSIS — N939 Abnormal uterine and vaginal bleeding, unspecified: Secondary | ICD-10-CM | POA: Diagnosis not present

## 2019-11-18 DIAGNOSIS — R030 Elevated blood-pressure reading, without diagnosis of hypertension: Secondary | ICD-10-CM

## 2019-11-18 DIAGNOSIS — R635 Abnormal weight gain: Secondary | ICD-10-CM

## 2019-11-18 NOTE — Progress Notes (Signed)
  Subjective:     Patient ID: [name], female   DOB: 05/22/1991, 29 y.o.   MRN: [id number]  HPI Aidel is a 29 year old white female,single, G2P1102 back in to discuss [place] and recheck BP.   Review of Systems Having bleeding on and off for last year +weight gain    Objective:   Physical Exam BP (!) 129/93 (BP Location: Left Arm, Patient Position: Sitting, Cuff Size: Normal)   Pulse 89   Ht 5\' 3"  (1.6 m)   Wt 268 lb (121.6 kg)   LMP 11/05/2019   BMI 47.47 kg/m   Skin warm and dry. Lungs: clear to ausculation bilaterally. Cardiovascular: regular rate and rhythm. Reviewed labs and 11/07/2019 again with pt. HGB 10.2,other labs normal and EEC 25 mm, with normal uterus and ovaries     Assessment:     1. Elevated BP without diagnosis of hypertension Referred to dietician  Watch salt and sugar  2. Weight gain Referred to dietician  3. Abnormal uterine bleeding (AUB) Return in 4 days to see Dr [place] about getting a tubal and ablation    Plan:     Review handouts on tubal sterilization and endometrial ablation

## 2019-11-22 ENCOUNTER — Ambulatory Visit: Payer: Medicaid Other | Admitting: Obstetrics and Gynecology

## 2019-11-28 ENCOUNTER — Ambulatory Visit: Payer: Medicaid Other | Admitting: Obstetrics and Gynecology

## 2019-12-04 ENCOUNTER — Encounter: Payer: Self-pay | Admitting: Obstetrics and Gynecology

## 2019-12-04 ENCOUNTER — Other Ambulatory Visit: Payer: Self-pay

## 2019-12-04 ENCOUNTER — Ambulatory Visit (INDEPENDENT_AMBULATORY_CARE_PROVIDER_SITE_OTHER): Payer: Medicaid Other | Admitting: Obstetrics and Gynecology

## 2019-12-04 VITALS — BP 128/88 | HR 91 | Ht 63.0 in | Wt 266.0 lb

## 2019-12-04 DIAGNOSIS — N939 Abnormal uterine and vaginal bleeding, unspecified: Secondary | ICD-10-CM

## 2019-12-04 MED ORDER — MEDROXYPROGESTERONE ACETATE 10 MG PO TABS: 10.0000 mg | ORAL_TABLET | Freq: Every day | ORAL | 0 refills | Status: DC

## 2019-12-04 NOTE — Progress Notes (Signed)
Patient ID: Christine Woodard, female   DOB: 1991-06-12, 29 y.o.   MRN: [id number]    [facility] Clinic Visit  @DATE @            Patient name: Christine Woodard MRN [name]  Date of birth: [birth year]  CC & HPI:  KITARA HEBB is a 29 y.o. female presenting today to discuss having a tubal ligation and ablation. She reports intermittent abnormal vaginal bleeding for the last 1-2 years. She states that her menstrual period did not return to normal after having her last child, who is almost two years old. Her period returned to normal after having her first child. She states that she is passing clots frequently and that the bleeding fluctuates between heavy and spotting. She is not breastfeeding and she is not on any birth control at this time.  ROS:  ROS + intermittent abnormal vaginal bleeding + passing clots  Pertinent History Reviewed:   Reviewed Medical         Past Medical History:  Diagnosis Date  . Back pain   . Eclampsia   . Headache(784.0)   . Seizures (HCC)    during pregnancy                              Surgical Hx:   No past surgical history on file. Medications: Reviewed & Updated - see associated section                      No current outpatient medications on file.   Social History: Reviewed -  reports that she has never smoked. She has never used smokeless tobacco.  Objective Findings:  Vitals: Blood pressure 128/88, pulse 91, height 5\' 3"  (1.6 m), weight 266 lb (120.7 kg), last menstrual period 11/05/2019, not currently breastfeeding.  PHYSICAL EXAMINATION General appearance - alert, well appearing, and in no distress Mental status - normal mood, behavior, speech, dress, motor activity, and thought processes, affect appropriate to mood  Assessment & Plan:   A:  1. Intermittent abnormal vaginal bleeding  P:  1. Extensive discussion of her options: ablation, tubal ligation, and IUD. The patient has opted to proceed with an IUD. She will be on  Provera for 10 days and will return in the next 2-3 weeks for IUD insertion.   By signing my name below, I, , attest that this documentation has been prepared under the direction and in the presence of 11/07/2019, MD. Electronically Signed: Nikki Dom Medical Scribe. 12/04/19. 11:29 AM.  I personally performed the services described in this documentation, which was SCRIBED in my presence. The recorded information has been reviewed and considered accurate. It has been edited as necessary during review. Nikki Dom, MD

## 2019-12-25 ENCOUNTER — Ambulatory Visit: Payer: Medicaid Other | Admitting: Women's Health

## 2020-01-07 ENCOUNTER — Other Ambulatory Visit: Payer: Self-pay

## 2020-01-07 ENCOUNTER — Ambulatory Visit: Payer: Medicaid Other | Admitting: Women's Health

## 2020-01-21 ENCOUNTER — Encounter: Payer: Self-pay | Admitting: Nutrition

## 2020-01-21 ENCOUNTER — Encounter: Payer: Medicaid Other | Attending: Adult Health | Admitting: Nutrition

## 2020-01-21 ENCOUNTER — Other Ambulatory Visit: Payer: Self-pay

## 2020-01-21 VITALS — Ht 63.0 in | Wt 267.0 lb

## 2020-01-21 DIAGNOSIS — R635 Abnormal weight gain: Secondary | ICD-10-CM

## 2020-01-21 DIAGNOSIS — E669 Obesity, unspecified: Secondary | ICD-10-CM | POA: Diagnosis not present

## 2020-01-21 NOTE — Progress Notes (Signed)
  Medical Nutrition Therapy:  Appt start time: 1300 end time:  1400.  Assessment:  Primary concerns today: Overweight. She lives with her husband and 2 children. She cooks and shops. Stay at home mom.  She has been wanting to lose weight. She has ongoing spotting from her cycle. Had lost 100 lbs 2 years ago.  Went from 270 lbs  down to 135 lbs over a year. Gained it all back. Eats 1-2 meals per day. Gets full quickly. Doesn't like the way she feels or looks.  Tends to graze daily instead of eat full balanced meals. Willing to make lifestyle changes to allow for weight loss and prevent diabetes. She admits to feeling depressed about her weight and not having any energy or motivation at times. Doesn't work outside the home.  Preferred Learning Style:  Auditory  Visual  Learning Readiness:   Ready  Change in progress   MEDICATIONS:   DIETARY INTAKE:    24-hr recall:  B ( AM): egg and spinach wrap,  Snk ( AM):  L ( PM): skip Snk ( PM): soda D ( PM): steak, asparagus and corn, Crystal light,  Snk ( PM): water, crystal light  Beverages: crystal light.  Usual physical activity:  Estimated energy needs: 1200 calories 135 g carbohydrates 90 g protein 33 g fat  Progress Towards Goal(s):  In progress.   Nutritional Diagnosis:  NI-1.5 Excessive energy intake As related to HIgh calorie diet.  As evidenced by BMI 47..    Intervention: Nutrition and  Pre Diabetes education provided on My Plate, CHO counting, meal planning, portion sizes, timing of meals, avoiding snacks between meals  taking medications as prescribed, benefits of exercising 30 minutes per day and prevention of complications of DM. Marland Kitchen Goals  Get 30 minutes of exercise 5 times per week. Avoid skipping meals. Eat three meals per day Drink only water and cut out crystal light. Lose 1 lb per week. Keep food journal  Teaching Method Utilized:  Visual Auditory Hands on  Handouts given during visit  include:  The Plate Method   Meal Plan Card  Weight loss tips  Mindful eating.  Barriers to learning/adherence to lifestyle change: none  Demonstrated degree of understanding via:  Teach Back   Monitoring/Evaluation:  Dietary intake, exercise, , and body weight in 1 month(s).  She would benefit from referral to mental health counseling for depression and overeating issues.

## 2020-01-21 NOTE — Patient Instructions (Addendum)
Goals  Get 30 minutes of exercise 5 times per week. Avoid skipping meals. Eat three meals per day Drink only water and cut out crystal light. Lose 1 lb per week. Keep food journal

## 2020-01-22 ENCOUNTER — Ambulatory Visit: Payer: Medicaid Other | Admitting: Women's Health

## 2020-01-22 ENCOUNTER — Other Ambulatory Visit: Payer: Self-pay

## 2020-01-22 ENCOUNTER — Encounter: Payer: Self-pay | Admitting: Women's Health

## 2020-01-22 VITALS — BP 126/89 | HR 98 | Ht 63.0 in | Wt 267.0 lb

## 2020-01-22 DIAGNOSIS — Z30430 Encounter for insertion of intrauterine contraceptive device: Secondary | ICD-10-CM | POA: Insufficient documentation

## 2020-01-22 DIAGNOSIS — N898 Other specified noninflammatory disorders of vagina: Secondary | ICD-10-CM | POA: Diagnosis not present

## 2020-01-22 DIAGNOSIS — Z3202 Encounter for pregnancy test, result negative: Secondary | ICD-10-CM | POA: Diagnosis not present

## 2020-01-22 LAB — POCT URINE PREGNANCY: Preg Test, Ur: NEGATIVE

## 2020-01-22 LAB — POCT WET PREP (WET MOUNT)
Clue Cells Wet Prep Whiff POC: NEGATIVE
Trichomonas Wet Prep HPF POC: ABSENT

## 2020-01-22 MED ORDER — LEVONORGESTREL 19.5 MCG/DAY IU IUD: INTRAUTERINE_SYSTEM | Freq: Once | INTRAUTERINE | Status: AC

## 2020-01-22 NOTE — Progress Notes (Signed)
IUD INSERTION Patient name: Christine Woodard MRN [id number]  Date of birth: 23-Oct-1990 Subjective Findings:   Christine Woodard is a 29 y.o. G13P1102 Caucasian female being seen today for insertion of a Liletta IUD for irregular periods and contraception. Also reports leaking clear malodorous fluid from vagina and rectum x few weeks. Partner says he doesn't smell anything.  No leakage of stool from vagina. Loose stools with her periods/bleeding.  Depression screen [facility] 2/9 01/21/2020 11/05/2019 11/05/2019  Decreased Interest 2 1 1   Down, Depressed, Hopeless 1 1 1   PHQ - 2 Score 3 2 2   Altered sleeping 3 3 -  Tired, decreased energy 3 3 -  Change in appetite 3 3 -  Feeling bad or failure about yourself  1 1 -  Trouble concentrating 0 1 -  Moving slowly or fidgety/restless 0 1 -  Suicidal thoughts 0 0 -  PHQ-9 Score 13 14 -  Difficult doing work/chores Somewhat difficult - -    Patient's last menstrual period was 01/15/2020. Last sexual intercourse was >2wks ago Last pap2/23/21. Results were:  neg w/ +HRHPV  The risks and benefits of the method and placement have been thouroughly reviewed with the patient and all questions were answered.  Specifically the patient is aware of failure rate of 09/998, expulsion of the IUD and of possible perforation.  The patient is aware of irregular bleeding due to the method and understands the incidence of irregular bleeding diminishes with time.  Signed copy of informed consent in chart.  Pertinent History Reviewed:   Reviewed past medical,surgical, social, obstetrical and family history.  Reviewed problem list, medications and allergies. Objective Findings & Procedure:   Vitals:   01/22/20 1046  BP: 126/89  Pulse: 98  Weight: 267 lb (121.1 kg)  Height: 5\' 3"  (1.6 m)  Body mass index is 47.3 kg/m.  Results for orders placed or performed in visit on 01/22/20 (from the past 24 hour(s))  POCT urine pregnancy   Collection Time: 01/22/20 10:53 AM    Result Value Ref Range   Preg Test, Ur Negative Negative  POCT Wet Prep 03/23/20 [place])   Collection Time: 01/22/20 11:23 AM  Result Value Ref Range   Source Wet Prep POC vaginal    WBC, Wet Prep HPF POC none    Bacteria Wet Prep HPF POC Few Few   BACTERIA WET PREP MORPHOLOGY POC     Clue Cells Wet Prep HPF POC None None   Clue Cells Wet Prep Whiff POC Negative Whiff    Yeast Wet Prep HPF POC None None   KOH Wet Prep POC     Trichomonas Wet Prep HPF POC Absent Absent     Time out was performed.  A graves speculum was placed in the vagina.  The cervix was visualized, prepped using Betadine, and grasped with a single tooth tenaculum. The uterus was found to be anteroflexed and it sounded to 9 cm.  Liletta  IUD placed per manufacturer's recommendations. The strings were trimmed to approximately 3 cm. The patient tolerated the procedure well.   Rectal exam: no masses/defects, does have small rectocele, small non-thrombosed external hemorrhoids  Informal transvaginal sonogram was performed and the proper placement of the IUD was verified.  Chaperone: 03/23/20 & Plan:   1) Liletta IUD insertion The patient was given post procedure instructions, including signs and symptoms of infection and to check for the strings after each menses or each month, and refraining from intercourse  or anything in the vagina for 3 days. She was given a care card with date IUD placed, and date IUD to be removed. She is scheduled for a f/u appointment in 4 weeks.  2) Leakage clear malodorous fluid from vagina and rectum> normal wet prep, nonodorous on exam, normal rectal exam other than small rectocele and small hemorrhoids.   Orders Placed This Encounter  Procedures  . POCT urine pregnancy  . POCT Wet Prep [facility])    Return in about 4 weeks (around 02/19/2020) for F/U, CNM, in person.  [place], [facility] 01/22/2020 11:32 AM

## 2020-01-22 NOTE — Patient Instructions (Signed)
 Nothing in vagina for 3 days (no sex, douching, tampons, etc...)  Check your strings once a month to make sure you can feel them, if you are not able to please let us know  If you develop a fever of 100.4 or more in the next few weeks, or if you develop severe abdominal pain, please let us know  Use a backup method of birth control, such as condoms, for 2 weeks     Intrauterine Device Insertion, Care After  This sheet gives you information about how to care for yourself after your procedure. Your health care provider may also give you more specific instructions. If you have problems or questions, contact your health care provider. What can I expect after the procedure? After the procedure, it is common to have:  Cramps and pain in the abdomen.  Light bleeding (spotting) or heavier bleeding that is like your menstrual period. This may last for up to a few days.  Lower back pain.  Dizziness.  Headaches.  Nausea. Follow these instructions at home:  Before resuming sexual activity, check to make sure that you can feel the IUD string(s). You should be able to feel the end of the string(s) below the opening of your cervix. If your IUD string is in place, you may resume sexual activity. ? If you had a hormonal IUD inserted more than 7 days after your most recent period started, you will need to use a backup method of birth control for 7 days after IUD insertion. Ask your health care provider whether this applies to you.  Continue to check that the IUD is still in place by feeling for the string(s) after every menstrual period, or once a month.  Take over-the-counter and prescription medicines only as told by your health care provider.  Do not drive or use heavy machinery while taking prescription pain medicine.  Keep all follow-up visits as told by your health care provider. This is important. Contact a health care provider if:  You have bleeding that is heavier or lasts longer  than a normal menstrual cycle.  You have a fever.  You have cramps or abdominal pain that get worse or do not get better with medicine.  You develop abdominal pain that is new or is not in the same area of earlier cramping and pain.  You feel lightheaded or weak.  You have abnormal or bad-smelling discharge from your vagina.  You have pain during sexual activity.  You have any of the following problems with your IUD string(s): ? The string bothers or hurts you or your sexual partner. ? You cannot feel the string. ? The string has gotten longer.  You can feel the IUD in your vagina.  You think you may be pregnant, or you miss your menstrual period.  You think you may have an STI (sexually transmitted infection). Get help right away if:  You have flu-like symptoms.  You have a fever and chills.  You can feel that your IUD has slipped out of place. Summary  After the procedure, it is common to have cramps and pain in the abdomen. It is also common to have light bleeding (spotting) or heavier bleeding that is like your menstrual period.  Continue to check that the IUD is still in place by feeling for the string(s) after every menstrual period, or once a month.  Keep all follow-up visits as told by your health care provider. This is important.  Contact your health care provider   if you have problems with your IUD string(s), such as the string getting longer or bothering you or your sexual partner. This information is not intended to replace advice given to you by your health care provider. Make sure you discuss any questions you have with your health care provider. Document Revised: 08/11/2017 Document Reviewed: 07/20/2016 Elsevier Patient Education  2020 Elsevier Inc.  

## 2020-01-30 ENCOUNTER — Encounter: Payer: Self-pay | Admitting: Nutrition

## 2020-02-06 ENCOUNTER — Encounter: Payer: Self-pay | Admitting: Advanced Practice Midwife

## 2020-02-06 ENCOUNTER — Ambulatory Visit: Payer: Medicaid Other | Admitting: Advanced Practice Midwife

## 2020-02-06 VITALS — BP 133/93 | HR 92 | Ht 63.0 in | Wt 266.0 lb

## 2020-02-06 DIAGNOSIS — Z30431 Encounter for routine checking of intrauterine contraceptive device: Secondary | ICD-10-CM | POA: Diagnosis not present

## 2020-02-06 NOTE — Progress Notes (Signed)
History:  29 y.o. [id number] here today for today for IUD string check; Liletta IUD was placed 01/22/20.  Had sex and bled and now can't feel strings. Bleeding lightening up  The following portions of the patient's history were reviewed and updated as appropriate: allergies, current medications, past family history, past medical history, past social history, past surgical history and problem list.  Review of Systems:   Constitutional: Negative for fever and chills Eyes: Negative for visual disturbances Respiratory: Negative for shortness of breath, dyspnea Cardiovascular: Negative for chest pain or palpitations  Gastrointestinal: Negative for vomiting, diarrhea and constipation Genitourinary: Negative for dysuria and urgency Musculoskeletal: Negative for back pain, joint pain, myalgias  Neurological: Negative for dizziness and headaches    Objective:  Physical Exam Blood pressure (!) 133/93, pulse 92, height 5\' 3"  (1.6 m), weight 266 lb (120.7 kg), last menstrual period 01/15/2020, not currently breastfeeding. Gen: NAD Abd: Soft, nontender and nondistended Pelvic: Bedside 03/16/2020 reveals properly place IUD. SSE:  Strings visible.  Assessment & Plan:  Normal IUD check. Patient to keep IUD in place for 6 years; can come in for removal if she desires pregnancy.

## 2020-02-19 ENCOUNTER — Ambulatory Visit: Payer: Medicaid Other | Admitting: Women's Health

## 2020-02-27 ENCOUNTER — Ambulatory Visit: Payer: Medicaid Other | Admitting: Nutrition

## 2020-04-02 DIAGNOSIS — D649 Anemia, unspecified: Secondary | ICD-10-CM | POA: Diagnosis not present

## 2020-04-02 DIAGNOSIS — G43009 Migraine without aura, not intractable, without status migrainosus: Secondary | ICD-10-CM | POA: Diagnosis not present

## 2020-04-02 DIAGNOSIS — Z6841 Body Mass Index (BMI) 40.0 and over, adult: Secondary | ICD-10-CM | POA: Diagnosis not present

## 2020-04-02 DIAGNOSIS — Z8759 Personal history of other complications of pregnancy, childbirth and the puerperium: Secondary | ICD-10-CM | POA: Diagnosis not present

## 2020-04-02 DIAGNOSIS — K2970 Gastritis, unspecified, without bleeding: Secondary | ICD-10-CM | POA: Diagnosis not present

## 2020-04-02 DIAGNOSIS — R195 Other fecal abnormalities: Secondary | ICD-10-CM | POA: Diagnosis not present

## 2020-04-02 DIAGNOSIS — H53123 Transient visual loss, bilateral: Secondary | ICD-10-CM | POA: Diagnosis not present

## 2020-04-02 DIAGNOSIS — N939 Abnormal uterine and vaginal bleeding, unspecified: Secondary | ICD-10-CM | POA: Diagnosis not present

## 2020-04-14 DIAGNOSIS — Z6841 Body Mass Index (BMI) 40.0 and over, adult: Secondary | ICD-10-CM | POA: Diagnosis not present

## 2020-04-14 DIAGNOSIS — H53123 Transient visual loss, bilateral: Secondary | ICD-10-CM | POA: Diagnosis not present

## 2020-04-14 DIAGNOSIS — D509 Iron deficiency anemia, unspecified: Secondary | ICD-10-CM | POA: Diagnosis not present

## 2020-04-14 DIAGNOSIS — G43009 Migraine without aura, not intractable, without status migrainosus: Secondary | ICD-10-CM | POA: Diagnosis not present

## 2020-04-14 DIAGNOSIS — R7303 Prediabetes: Secondary | ICD-10-CM | POA: Diagnosis not present

## 2020-04-14 DIAGNOSIS — N643 Galactorrhea not associated with childbirth: Secondary | ICD-10-CM | POA: Diagnosis not present

## 2020-04-14 DIAGNOSIS — Z3202 Encounter for pregnancy test, result negative: Secondary | ICD-10-CM | POA: Diagnosis not present

## 2020-04-24 DIAGNOSIS — R9082 White matter disease, unspecified: Secondary | ICD-10-CM | POA: Diagnosis not present

## 2020-04-24 DIAGNOSIS — N643 Galactorrhea not associated with childbirth: Secondary | ICD-10-CM | POA: Diagnosis not present

## 2020-05-13 ENCOUNTER — Encounter: Payer: Self-pay | Admitting: Neurology

## 2020-05-13 ENCOUNTER — Other Ambulatory Visit: Payer: Self-pay

## 2020-05-13 ENCOUNTER — Ambulatory Visit: Payer: Medicaid Other | Admitting: Neurology

## 2020-05-13 VITALS — BP 141/90 | HR 66 | Ht 63.0 in | Wt 263.0 lb

## 2020-05-13 DIAGNOSIS — R5383 Other fatigue: Secondary | ICD-10-CM | POA: Diagnosis not present

## 2020-05-13 DIAGNOSIS — R208 Other disturbances of skin sensation: Secondary | ICD-10-CM | POA: Diagnosis not present

## 2020-05-13 DIAGNOSIS — H539 Unspecified visual disturbance: Secondary | ICD-10-CM

## 2020-05-13 DIAGNOSIS — G4700 Insomnia, unspecified: Secondary | ICD-10-CM | POA: Diagnosis not present

## 2020-05-13 DIAGNOSIS — R9082 White matter disease, unspecified: Secondary | ICD-10-CM | POA: Diagnosis not present

## 2020-05-13 DIAGNOSIS — R635 Abnormal weight gain: Secondary | ICD-10-CM

## 2020-05-13 MED ORDER — ESCITALOPRAM OXALATE 10 MG PO TABS
10.0000 mg | ORAL_TABLET | Freq: Every day | ORAL | 5 refills | Status: DC
Start: 2020-05-13 — End: 2022-05-13

## 2020-05-13 MED ORDER — GABAPENTIN 300 MG PO CAPS: ORAL_CAPSULE | ORAL | 11 refills | Status: DC

## 2020-05-13 NOTE — Progress Notes (Signed)
[facility]  PATIENT: Christine Woodard DOB: September 26, 1990  REFERRING DOCTOR OR PCP:    Qu[place] Alto SOURCE: Patient, notes from primary care, MRI report, MRI images personally reviewed. _________________________________   HISTORICAL  CHIEF COMPLAINT:  Chief Complaint  Patient presents with  . New Patient (Initial Visit)    RM 45 with husband, Apolinar Junes. Paper referral from Dr. Leandrew Koyanagi for MRI changes.Having sharp pain throughout body today. Reports numbness on left side of neck and legs. The back of her right leg for the past week has been numb. Has a headache today, took [place] today. Numbness on left side of face. Has eye twitch for past week in left eye. Also has muscle weakness in her arms/hands.    HISTORY OF PRESENT ILLNESS:  I had the pleasure seeing your patient, Christine Woodard, at [facility] for neurologic consultation regarding her multiple sensory and other symptoms and abnormal brain MRI.  She is a 29 year old woman with a history of migraine headaches x 3 years that have worsened over the past few months.  She has noted transient episodes of numbness.   The longest episode was 2-3 hours .  These can be random throughout the day and can involve any limb.    Currently, she notes numbness in the left face and legs.    On good days, she just gets occasional short episodes.   She notes sharp pain in the arms and legs that is more constant..   These are distinct from the numbness episodes that are more fluctuating.    A few months ago, she noted darkening of her bilateral vision for several minutes.  The longest episode was about one minute and this was occurring a few times a day.  After a month these episodes also stopped.    These spells could occur standing or sitting.    She did not feel lightheaded.     She feels her gait is mildly off balanced.   Standing up is difficult at times because she feels weak.   She notes pain and numbness  in her legs at times when she walks.  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     She has fatigue and sleepiness during the day and has sleep maintenance insomnia.  She does not snore much.   She has anxiety and depression.       Due to menstrual cycle irregularity for the past couple years and more recent galactorrhea (she weaned 1 1/2 years ago a few months after daughter's birth), she underwent MRI of the brain with pituitary gland imaging on 04/24/2020.  The pituitary gland was normal.  She did have a mild extent of T2/FLAIR hyperintense foci noted to be nonspecific by the radiologist.  MS was placed in the differential diagnosis.  She has gained 100 pounds since her pregnancy.   Image Review I personally reviewed the MRI of the brain from 04/24/2020.  It does show some scattered T2/FLAIR hyperintense foci, predominantly in the deep white matter.  None of the foci are juxtacortical and only one appears periventricular.  There are no lesions in the infratentorial white matter.  They do not enhance or appear to be acute.  These foci are nonspecific.     MRI of the lumbar spine 10/25/2011 showed about 4 to 5 mm anterolisthesis at L5-S1 associated with left foraminal narrowing but no definite nerve root compression.  REVIEW OF SYSTEMS: Constitutional: No fevers, chills, sweats, or change in appetite.  She has fatigue and sleepiness.  She has insomnia. Eyes: No visual changes, double vision, eye pain Ear, nose and throat: No hearing loss, ear pain, nasal congestion, sore throat Cardiovascular: No chest pain, palpitations Respiratory: No shortness of breath at rest or with exertion.   No wheezes GastrointestinaI: No nausea, vomiting, diarrhea, abdominal pain, fecal incontinence Genitourinary: No dysuria, urinary retention or frequency.  No nocturia. Musculoskeletal:As above  integumentary: No rash, pruritus, skin lesions Neurological: as above Psychiatric: Notes anxiety and  depression Endocrine: She has weight gain and galactorrhea. Hematologic/Lymphatic: No anemia, purpura, petechiae. Allergic/Immunologic: No itchy/runny eyes, nasal congestion, recent allergic reactions, rashes  ALLERGIES: Allergies  Allergen Reactions  . Amoxicillin Hives and Swelling    Swelling to throat Has patient had a PCN reaction causing immediate rash, facial/tongue/throat swelling, SOB or lightheadedness with hypotension: Yes Has patient had a PCN reaction causing severe rash involving mucus membranes or skin necrosis: No Has patient had a PCN reaction that required hospitalization: No Has patient had a PCN reaction occurring within the last 10 years: No If all of the above answers are "NO", then may proceed with Cephalosporin use.   . Latex Rash    HOME MEDICATIONS:  Current Outpatient Medications:  .  Ferrous Sulfate (IRON PO), Take 1 tablet by mouth 2 (two) times daily., Disp: , Rfl:  .  IUD'S IU, by Intrauterine route., Disp: , Rfl:  .  UBRELVY 50 MG TABS, Take 50 mg by mouth as needed., Disp: , Rfl:   PAST MEDICAL HISTORY: Past Medical History:  Diagnosis Date  . Back pain   . Eclampsia   . Headache(784.0)   . Seizures (HCC)    during pregnancy    PAST SURGICAL HISTORY: No past surgical history on file.  FAMILY HISTORY: Family History  Problem Relation Age of Onset  . Diabetes Father   . Heart disease Father   . Diabetes Paternal Grandmother   . Hypertension Paternal Grandmother   . Diabetes Paternal Grandfather   . Breast cancer Maternal Grandmother   . Cancer Maternal Grandmother   . Thyroid disease Maternal Grandmother   . Heart disease Maternal Grandfather   . Hypotension Mother   . Anemia Mother   . Irritable bowel syndrome Mother   . Seizures Sister   . Hypertension Sister   . Thyroid disease Sister   . Hypertension Sister   . Diabetes Sister        prediabetic  . Anemia Sister     SOCIAL HISTORY:  Social History   Socioeconomic  History  . Marital status: Single    Spouse name: Apolinar Junes  . Number of children: 2  . Years of education: Not on file  . Highest education level: Not on file  Occupational History  . Occupation: Stay at home mother  Tobacco Use  . Smoking status: Never Smoker  . Smokeless tobacco: Never Used  Vaping Use  . Vaping Use: Never used  Substance and Sexual Activity  . Alcohol use: Yes    Comment: seldom  . Drug use: No  . Sexual activity: Yes    Birth control/protection: None  Other Topics Concern  . Not on file  Social History Narrative   Right handed       Caffeine use: Soda- 4-5 cans per day   Social Determinants of Health   Financial Resource Strain:   . Difficulty of Paying Living Expenses: Not on file  Food Insecurity:   . Worried About Programme researcher, broadcasting/film/video in  the Last Year: Not on file  . Ran Out of Food in the Last Year: Not on file  Transportation Needs:   . Lack of Transportation (Medical): Not on file  . Lack of Transportation (Non-Medical): Not on file  Physical Activity:   . Days of Exercise per Week: Not on file  . Minutes of Exercise per Session: Not on file  Stress:   . Feeling of Stress : Not on file  Social Connections:   . Frequency of Communication with Friends and Family: Not on file  . Frequency of Social Gatherings with Friends and Family: Not on file  . Attends Religious Services: Not on file  . Active Member of Clubs or Organizations: Not on file  . Attends BankerClub or Organization Meetings: Not on file  . Marital Status: Not on file  Intimate Partner Violence:   . Fear of Current or Ex-Partner: Not on file  . Emotionally Abused: Not on file  . Physically Abused: Not on file  . Sexually Abused: Not on file     PHYSICAL EXAM  Vitals:   05/13/20 1308  BP: (!) 141/90  Pulse: 66  SpO2: 99%  Weight: 263 lb (119.3 kg)  Height: 5\' 3"  (1.6 m)    Body mass index is 46.59 kg/m.   General: The patient is well-developed and well-nourished and  in no acute distress  HEENT:  Head is [place]/AT.  Sclera are anicteric.  Funduscopic exam shows normal optic discs and retinal vessels.  Pharynx is Mallampati 2  Neck: No carotid bruits are noted.  The neck is mildly tender.  Range of motion was normal..   Cardiovascular: The heart has a regular rate and rhythm with a normal S1 and S2. There were no murmurs, gallops or rubs.    Skin: Extremities are without rash or  edema.  Musculoskeletal: She has some tenderness over some of the classic fibromyalgia tender points including the upper chest and upper back.  Low back is mildly tender.  Neurologic Exam  Mental status: The patient is alert and oriented x 3 at the time of the examination. The patient has apparent normal recent and remote memory, with an apparently normal attention span and concentration ability.   Speech is normal.  Cranial nerves: Extraocular movements are full. Pupils are equal, round, and reactive to light and accomodation.  Visual fields are full.  Facial symmetry is present. There is good facial sensation to soft touch bilaterally.Facial strength is normal.  Trapezius and sternocleidomastoid strength is normal. No dysarthria is noted.  The tongue is midline, and the patient has symmetric elevation of the soft palate. No obvious hearing deficits are noted.  Motor:  Muscle bulk is normal.   Tone is normal. Strength is  5 / 5 in all 4 extremities.   Sensory: Sensory testing shows symmetric sensation to vibration but reduced touch/temp sensation in right arm/hand relative left.    Coordination: Cerebellar testing reveals good finger-nose-finger and heel-to-shin bilaterally.  Gait and station: Station is normal.   Gait is normal. Tandem gait is minimally wide. Romberg is negative.   Reflexes: Deep tendon reflexes are symmetric and normal bilaterally.   Plantar responses are flexor.    DIAGNOSTIC DATA (LABS, IMAGING, TESTING) - I reviewed patient records, labs, notes, testing  and imaging myself where available.  Lab Results  Component Value Date   WBC 9.5 11/05/2019   HGB 10.2 (L) 11/05/2019   HCT 33.1 (L) 11/05/2019   MCV 82 11/05/2019   PLT  397 11/05/2019      Component Value Date/Time   NA 140 11/05/2019 1510   K 4.4 11/05/2019 1510   CL 102 11/05/2019 1510   CO2 22 11/05/2019 1510   GLUCOSE 84 11/05/2019 1510   GLUCOSE 123 (H) 09/12/2019 1725   BUN 6 11/05/2019 1510   CREATININE 0.60 11/05/2019 1510   CALCIUM 8.9 11/05/2019 1510   PROT 7.2 11/05/2019 1510   ALBUMIN 4.3 11/05/2019 1510   AST 24 11/05/2019 1510   ALT 21 11/05/2019 1510   ALKPHOS 80 11/05/2019 1510   BILITOT <0.2 11/05/2019 1510   GFRNONAA 126 11/05/2019 1510   GFRAA 145 11/05/2019 1510    Lab Results  Component Value Date   TSH 1.220 11/05/2019       ASSESSMENT AND PLAN  White matter abnormality on MRI of brain  Dysesthesia  Weight gain  Other fatigue  Vision disturbance  Insomnia, unspecified type   In summary, Ms. Szatkowski is a 29 year old woman with numbness and other somatic complaints who has scattered T2/FLAIR hyperintense foci on the MRI of the brain.  She is concerned about the possibility of MS.  I discussed with her and her husband that the characteristics of the lesion on MRI are nonspecific and actually have an appearance more consistent with chronic microvascular ischemic change or even sequela of migraine headache rather than demyelination.  However, given her age demyelination is still in the differential diagnosis.  I think the chance that she has MS is under 20%.  She does have some characteristics of fibromyalgia and also has poor sleep and mood issues.  Therefore, I will place her on gabapentin for the dysesthetic pain and myalgias.  This may also help her sleep.  I will also write for Lexapro to help with the mood issues including anxiety.  We will need to recheck an MRI of the brain in 6 to 9 months to determine if there has been any progression.   If there is progression I would consider obtaining CSF to see if she has oligoclonal bands  She will return to see me in 4 months or sooner for new or worsening neurologic symptoms.   Thao Vanover A. Epimenio Foot, MD, [facility] 05/13/2020, 1:45 PM Certified in Neurology, Clinical Neurophysiology, Sleep Medicine and Neuroimaging  [facility] [address] [place], [place] [address] [phone]

## 2020-07-07 DIAGNOSIS — F411 Generalized anxiety disorder: Secondary | ICD-10-CM | POA: Diagnosis not present

## 2020-07-07 DIAGNOSIS — R7303 Prediabetes: Secondary | ICD-10-CM | POA: Diagnosis not present

## 2020-07-07 DIAGNOSIS — Z8759 Personal history of other complications of pregnancy, childbirth and the puerperium: Secondary | ICD-10-CM | POA: Diagnosis not present

## 2020-07-07 DIAGNOSIS — H53123 Transient visual loss, bilateral: Secondary | ICD-10-CM | POA: Diagnosis not present

## 2020-07-07 DIAGNOSIS — D649 Anemia, unspecified: Secondary | ICD-10-CM | POA: Diagnosis not present

## 2020-07-07 DIAGNOSIS — K2970 Gastritis, unspecified, without bleeding: Secondary | ICD-10-CM | POA: Diagnosis not present

## 2020-07-07 DIAGNOSIS — R195 Other fecal abnormalities: Secondary | ICD-10-CM | POA: Diagnosis not present

## 2020-07-07 DIAGNOSIS — N939 Abnormal uterine and vaginal bleeding, unspecified: Secondary | ICD-10-CM | POA: Diagnosis not present

## 2020-07-07 DIAGNOSIS — R208 Other disturbances of skin sensation: Secondary | ICD-10-CM | POA: Diagnosis not present

## 2020-07-07 DIAGNOSIS — Z6841 Body Mass Index (BMI) 40.0 and over, adult: Secondary | ICD-10-CM | POA: Diagnosis not present

## 2020-07-07 DIAGNOSIS — G43009 Migraine without aura, not intractable, without status migrainosus: Secondary | ICD-10-CM | POA: Diagnosis not present

## 2020-08-12 DIAGNOSIS — Z20828 Contact with and (suspected) exposure to other viral communicable diseases: Secondary | ICD-10-CM | POA: Diagnosis not present

## 2020-09-01 DIAGNOSIS — F411 Generalized anxiety disorder: Secondary | ICD-10-CM | POA: Diagnosis not present

## 2020-09-01 DIAGNOSIS — N643 Galactorrhea not associated with childbirth: Secondary | ICD-10-CM | POA: Diagnosis not present

## 2020-09-01 DIAGNOSIS — R0789 Other chest pain: Secondary | ICD-10-CM | POA: Diagnosis not present

## 2020-09-01 DIAGNOSIS — K580 Irritable bowel syndrome with diarrhea: Secondary | ICD-10-CM | POA: Diagnosis not present

## 2020-09-01 DIAGNOSIS — B354 Tinea corporis: Secondary | ICD-10-CM | POA: Diagnosis not present

## 2020-09-13 DIAGNOSIS — H5213 Myopia, bilateral: Secondary | ICD-10-CM | POA: Diagnosis not present

## 2020-09-16 ENCOUNTER — Ambulatory Visit: Payer: Medicaid Other | Admitting: Neurology

## 2020-10-01 DIAGNOSIS — Z20828 Contact with and (suspected) exposure to other viral communicable diseases: Secondary | ICD-10-CM | POA: Diagnosis not present

## 2020-10-01 DIAGNOSIS — B354 Tinea corporis: Secondary | ICD-10-CM | POA: Diagnosis not present

## 2020-10-01 DIAGNOSIS — K580 Irritable bowel syndrome with diarrhea: Secondary | ICD-10-CM | POA: Diagnosis not present

## 2021-07-09 ENCOUNTER — Emergency Department (HOSPITAL_COMMUNITY)
Admission: EM | Admit: 2021-07-09 | Discharge: 2021-07-09 | Disposition: A | Discharge status: Home / Self Care | Payer: Medicaid Other | Attending: Emergency Medicine | Admitting: Emergency Medicine

## 2021-07-09 ENCOUNTER — Emergency Department (HOSPITAL_COMMUNITY): Payer: Medicaid Other

## 2021-07-09 ENCOUNTER — Encounter (HOSPITAL_COMMUNITY): Payer: Self-pay | Admitting: *Deleted

## 2021-07-09 DIAGNOSIS — R55 Syncope and collapse: Secondary | ICD-10-CM | POA: Insufficient documentation

## 2021-07-09 DIAGNOSIS — Z91040 Latex allergy status: Secondary | ICD-10-CM | POA: Diagnosis not present

## 2021-07-09 DIAGNOSIS — R42 Dizziness and giddiness: Secondary | ICD-10-CM | POA: Diagnosis not present

## 2021-07-09 LAB — URINALYSIS, ROUTINE W REFLEX MICROSCOPIC
Bilirubin Urine: NEGATIVE
Glucose, UA: 50 mg/dL — AB
Hgb urine dipstick: NEGATIVE
Ketones, ur: NEGATIVE mg/dL
Leukocytes,Ua: NEGATIVE
Nitrite: NEGATIVE
Protein, ur: NEGATIVE mg/dL
Specific Gravity, Urine: 1.023 (ref 1.005–1.030)
pH: 6 (ref 5.0–8.0)

## 2021-07-09 LAB — CBC
HCT: 43 % (ref 36.0–46.0)
Hemoglobin: 14.3 g/dL (ref 12.0–15.0)
MCH: 31.6 pg (ref 26.0–34.0)
MCHC: 33.3 g/dL (ref 30.0–36.0)
MCV: 94.9 fL (ref 80.0–100.0)
Platelets: 298 10*3/uL (ref 150–400)
RBC: 4.53 MIL/uL (ref 3.87–5.11)
RDW: 12.4 % (ref 11.5–15.5)
WBC: 9.5 10*3/uL (ref 4.0–10.5)
nRBC: 0 % (ref 0.0–0.2)

## 2021-07-09 LAB — BASIC METABOLIC PANEL
Anion gap: 9 (ref 5–15)
BUN: 8 mg/dL (ref 6–20)
CO2: 25 mmol/L (ref 22–32)
Calcium: 9 mg/dL (ref 8.9–10.3)
Chloride: 103 mmol/L (ref 98–111)
Creatinine, Ser: 0.63 mg/dL (ref 0.44–1.00)
GFR, Estimated: >60 mL/min (ref >60)
Glucose, Bld: 142 mg/dL — ABNORMAL HIGH (ref 70–99)
Potassium: 3.8 mmol/L (ref 3.5–5.1)
Sodium: 137 mmol/L (ref 135–145)

## 2021-07-09 LAB — POC URINE PREG, ED: Preg Test, Ur: NEGATIVE

## 2021-07-09 LAB — CBG MONITORING, ED: Glucose-Capillary: 141 mg/dL — ABNORMAL HIGH (ref 70–99)

## 2021-07-09 LAB — LIPASE, BLOOD: Lipase: 29 U/L (ref 11–51)

## 2021-07-09 MED ORDER — VASOPRESSIN 20 UNITS/100 ML INFUSION FOR SHOCK
INTRAVENOUS | Status: AC
  Filled 2021-07-09: qty 100

## 2021-07-09 NOTE — ED Provider Notes (Signed)
Emergency Medicine Provider Triage Evaluation Note  Christine Woodard , a 30 y.o. female  was evaluated in triage.  Pt complains of near syncopal episode earlier when she was bending over, she quickly stood up felt that she had some spinning and felt that she was going to pass out.  Patient reports that she has had this feeling before, she does have a history of anemia, she does not endorse any increased bleeding over the last month.  Patient reports that she does have an IUD in place, does not currently get a menstrual period.  Patient also endorses some recent migraines, dizziness, as well as some lower abdominal pain.  Patient also has had a lot of diarrhea recently.  Patient denies any chest pain, shortness of breath, heart palpitations.  Review of Systems  Positive: Near syncope, headache, abdominal pain Negative: As above  Physical Exam  BP (!) 133/95   Pulse 93   Temp 98.6 F (37 C) (Oral)   Resp 20   Ht 5\' 3"  (1.6 m)   Wt 118 kg   SpO2 99%   BMI 46.08 kg/m  Gen:   Awake, no distress   Resp:  Normal effort  MSK:   Moves extremities without difficulty  Other:  Some TTP of lower abdomen  Medical Decision Making  Medically screening exam initiated at 5:27 PM.  Appropriate orders placed.  Christine Woodard was informed that the remainder of the evaluation will be completed by another provider, this initial triage assessment does not replace that evaluation, and the importance of remaining in the ED until their evaluation is complete.  Near syncope   Christine Woodard 07/09/21 1729    07/11/21, MD 07/10/21 [id number]

## 2021-07-09 NOTE — ED Notes (Signed)
Patient transported to X-ray 

## 2021-07-09 NOTE — ED Provider Notes (Addendum)
[facility] EMERGENCY DEPARTMENT Provider Note   CSN: [id number] Arrival date & time: 07/09/21  1636     History Chief Complaint  Patient presents with   syncopal episode    Christine Woodard is a 30 y.o. female with a history as outlined below, most significant for chronic migraine headaches, eclampsia with her last pregnancy presenting for evaluation of a near syncopal event.  She was shopping with her 27-year-old daughter and had been walking through the store for about 20 minutes when she started to feel lightheaded, her vision grayed out and she felt like she was going to pass out.  She found a bench to sit on and her symptoms improved after several minutes.  She did not have chest pain, shortness of breath, palpitations, diaphoresis, nausea or vomiting, no current headache and symptoms have spontaneously resolved.  She has had similar symptoms in the past with no diagnosis found for these episodes.  She had eaten meal about 30 minutes prior to the event.  She is currently symptom-free.  The symptoms occurred around 1130 this morning.    The history is provided by the patient.      Past Medical History:  Diagnosis Date   Back pain    Eclampsia    Headache(784.0)    Seizures (HCC)    during pregnancy    Patient Active Problem List   Diagnosis Date Noted   White matter abnormality on MRI of brain 05/13/2020   Dysesthesia 05/13/2020   Other fatigue 05/13/2020   Vision disturbance 05/13/2020   Insomnia 05/13/2020   Encounter for IUD insertion 01/22/2020   Hemorrhoids 11/05/2019   Abnormal uterine bleeding (AUB) 11/05/2019   Weight gain 11/05/2019   Elevated BP without diagnosis of hypertension 11/05/2019    History reviewed. No pertinent surgical history.   OB History     Gravida  2   Para  2   Term  1   Preterm  1   AB  0   Living  2      SAB  0   IAB  0   Ectopic  0   Multiple  0   Live Births  2           Family History  Problem Relation  Age of Onset   Diabetes Father    Heart disease Father    Diabetes Paternal Grandmother    Hypertension Paternal Grandmother    Diabetes Paternal Grandfather    Breast cancer Maternal Grandmother    Cancer Maternal Grandmother    Thyroid disease Maternal Grandmother    Heart disease Maternal Grandfather    Hypotension Mother    Anemia Mother    Irritable bowel syndrome Mother    Seizures Sister    Hypertension Sister    Thyroid disease Sister    Hypertension Sister    Diabetes Sister        prediabetic   Anemia Sister     Social History   Tobacco Use   Smoking status: Never   Smokeless tobacco: Never  Vaping Use   Vaping Use: Never used  Substance Use Topics   Alcohol use: Yes    Comment: seldom   Drug use: No    Home Medications Prior to Admission medications   Medication Sig Start Date End Date Taking? Authorizing Provider  escitalopram (LEXAPRO) 10 MG tablet Take 1 tablet (10 mg total) by mouth daily. 05/13/20   Sater, Pearletha Furl, MD  Ferrous Sulfate (IRON PO) Take  1 tablet by mouth 2 (two) times daily.    [provider]  gabapentin (NEURONTIN) 300 MG capsule One po qAM, one po qPM and two po qHS 05/13/20   Sater, Pearletha Furl, MD  IUD'S IU by Intrauterine route.    [provider]  UBRELVY 50 MG TABS Take 50 mg by mouth as needed. 04/15/20   [provider]    Allergies    Amoxicillin and Latex  Review of Systems   Review of Systems  Constitutional:  Negative for chills and fever.  HENT:  Negative for congestion and sore throat.   Eyes: Negative.   Respiratory:  Negative for chest tightness and shortness of breath.   Cardiovascular:  Negative for chest pain, palpitations and leg swelling.  Gastrointestinal:  Negative for abdominal pain, nausea and vomiting.  Genitourinary: Negative.   Musculoskeletal:  Negative for arthralgias, joint swelling and neck pain.  Skin: Negative.  Negative for rash and wound.  Neurological:  Positive for  light-headedness. Negative for weakness, numbness and headaches.       Near syncope  Psychiatric/Behavioral: Negative.    All other systems reviewed and are negative.  Physical Exam Updated Vital Signs BP 115/86   Pulse 99   Temp 98.6 F (37 C) (Oral)   Resp 17   Ht 5\' 3"  (1.6 m)   Wt 118 kg   SpO2 100%   BMI 46.08 kg/m   Physical Exam Vitals and nursing note reviewed.  Constitutional:      Appearance: She is well-developed.  HENT:     Head: Normocephalic and atraumatic.  Eyes:     Conjunctiva/sclera: Conjunctivae normal.  Cardiovascular:     Rate and Rhythm: Normal rate and regular rhythm.     Heart sounds: Normal heart sounds.  Pulmonary:     Effort: Pulmonary effort is normal.     Breath sounds: Normal breath sounds. No wheezing.  Abdominal:     General: Bowel sounds are normal.     Palpations: Abdomen is soft.     Tenderness: There is no abdominal tenderness. There is no guarding.  Musculoskeletal:        General: Normal range of motion.     Cervical back: Normal range of motion.  Skin:    General: Skin is warm and dry.  Neurological:     Mental Status: She is alert.    ED Results / Procedures / Treatments   Labs (all labs ordered are listed, but only abnormal results are displayed) Labs Reviewed  BASIC METABOLIC PANEL - Abnormal; Notable for the following components:      Result Value   Glucose, Bld 142 (*)    All other components within normal limits  URINALYSIS, ROUTINE W REFLEX MICROSCOPIC - Abnormal; Notable for the following components:   APPearance HAZY (*)    Glucose, UA 50 (*)    All other components within normal limits  CBG MONITORING, ED - Abnormal; Notable for the following components:   Glucose-Capillary 141 (*)    All other components within normal limits  CBC  LIPASE, BLOOD  POC URINE PREG, ED    EKG EKG Interpretation  Date/Time:  Friday July 09 2021 17:15:22 EDT Ventricular Rate:  92 PR Interval:  146 QRS Duration: 74 QT  Interval:  348 QTC Calculation: 430 R Axis:   32 Text Interpretation: Normal sinus rhythm Possible Anterior infarct , age undetermined Abnormal ECG since last tracing no significant change Confirmed by [id number] [phone]) on 07/09/2021 5:42:09  PM  Radiology DG ABD ACUTE 2+V W 1V CHEST  Result Date: 07/09/2021 CLINICAL DATA:  Near syncope EXAM: DG ABDOMEN ACUTE WITH 1 VIEW CHEST COMPARISON:  09/12/2019 FINDINGS: There is no evidence of dilated bowel loops or free intraperitoneal air. No radiopaque calculi or other significant radiographic abnormality is seen. Heart size and mediastinal contours are within normal limits. Both lungs are clear. IUD in the pelvis. Mild to moderate stool. IMPRESSION: Negative abdominal radiographs.  No acute cardiopulmonary disease. Electronically Signed   By: Jasmine Pang M.D.   On: 07/09/2021 19:36    Procedures Procedures   Medications Ordered in ED Medications - No data to display  ED Course  I have reviewed the triage vital signs and the nursing notes.  Pertinent labs & imaging results that were available during my care of the patient were reviewed by me and considered in my medical decision making (see chart for details).    MDM Rules/Calculators/A&P                           Labs and imaging reviewed and discussed with patient.  A full acute abdominal series was obtained after she mentioned that she has chronic low pelvic cramping since an IUD was placed last year by [facility].  Chest x-ray portion is clear, IUD is located in her low pelvis midline, appears to be in a normal position.  She was encouraged to follow-up with [facility] for further evaluation if the symptoms persist.  She has a benign abdominal exam on today's exam.  Orthostatics were obtained and are normal.  She was symptom-free during the course of her visit.  Advised follow-up with her PCP if her symptoms return, returning here for any worsening symptoms.  She has no chest pain,  had no shortness of breath, there is no concern for her symptoms being ACS or PE.  She is symptom-free during her ED stay.  I suspect this may have been a vasovagal event. Final Clinical Impression(s) / ED Diagnoses Final diagnoses:  Near syncope    Rx / DC Orders ED Discharge Orders     None        Victoriano Lain 07/09/21 2001    Burgess Amor, PA-C 07/09/21 2002    Mancel Bale, MD 07/10/21 [phone]

## 2021-07-09 NOTE — ED Triage Notes (Signed)
Near syncopal episode while shopping

## 2021-07-09 NOTE — Discharge Instructions (Signed)
Your exam, x-rays, lab tests and vital signs here are stable and reassuring.  Rest make sure you are drinking plenty of fluids.  Plan for recheck by your primary doctor if your symptoms persist or become more frequent.

## 2021-07-12 ENCOUNTER — Telehealth: Payer: Self-pay

## 2021-07-12 NOTE — Telephone Encounter (Signed)
Transition Care Management Unsuccessful Follow-up Telephone Call  Date of discharge and from where:  07/09/2021-[place]  Attempts:  1st Attempt  Reason for unsuccessful TCM follow-up call:  Voice mail full

## 2021-07-13 NOTE — Telephone Encounter (Signed)
Transition Care Management Unsuccessful Follow-up Telephone Call  Date of discharge and from where:  07/09/2021 from [facility]  Attempts:  2nd Attempt  Reason for unsuccessful TCM follow-up call:  Voice mail full

## 2021-07-14 NOTE — Telephone Encounter (Signed)
Transition Care Management Unsuccessful Follow-up Telephone Call  Date of discharge and from where:  07/10/2021 from [facility]  Attempts:  3rd Attempt  Reason for unsuccessful TCM follow-up call:  Unable to reach patient

## 2021-08-13 DIAGNOSIS — J101 Influenza due to other identified influenza virus with other respiratory manifestations: Secondary | ICD-10-CM | POA: Diagnosis not present

## 2021-08-13 DIAGNOSIS — R059 Cough, unspecified: Secondary | ICD-10-CM | POA: Diagnosis not present

## 2021-08-13 DIAGNOSIS — R509 Fever, unspecified: Secondary | ICD-10-CM | POA: Diagnosis not present

## 2021-08-13 DIAGNOSIS — Z20828 Contact with and (suspected) exposure to other viral communicable diseases: Secondary | ICD-10-CM | POA: Diagnosis not present

## 2021-09-03 DIAGNOSIS — J019 Acute sinusitis, unspecified: Secondary | ICD-10-CM | POA: Diagnosis not present

## 2021-09-03 DIAGNOSIS — J029 Acute pharyngitis, unspecified: Secondary | ICD-10-CM | POA: Diagnosis not present

## 2021-09-03 DIAGNOSIS — Z20828 Contact with and (suspected) exposure to other viral communicable diseases: Secondary | ICD-10-CM | POA: Diagnosis not present

## 2021-10-28 ENCOUNTER — Other Ambulatory Visit: Payer: Self-pay | Admitting: Neurology

## 2021-11-25 DIAGNOSIS — A0811 Acute gastroenteropathy due to Norwalk agent: Secondary | ICD-10-CM | POA: Diagnosis not present

## 2021-11-25 DIAGNOSIS — Z6841 Body Mass Index (BMI) 40.0 and over, adult: Secondary | ICD-10-CM | POA: Diagnosis not present

## 2021-11-25 DIAGNOSIS — Z20828 Contact with and (suspected) exposure to other viral communicable diseases: Secondary | ICD-10-CM | POA: Diagnosis not present

## 2021-11-25 DIAGNOSIS — R509 Fever, unspecified: Secondary | ICD-10-CM | POA: Diagnosis not present

## 2021-12-31 ENCOUNTER — Other Ambulatory Visit: Payer: Self-pay | Admitting: Neurology

## 2022-01-03 ENCOUNTER — Telehealth: Payer: Self-pay | Admitting: Neurology

## 2022-01-03 NOTE — Telephone Encounter (Signed)
Pt requesting refill for gabapentin (NEURONTIN) 300 MG capsule at [facility] [phone].  ?Pt has scheduled an appt 02/17/2022.  ?

## 2022-01-03 NOTE — Telephone Encounter (Signed)
Called pt. Advised since she has not been seen since 2021, we are unable to refill. ? ?We last refilled since 2021. She asked if it were fine to just stop med. I asked who she has been getting refills from since our last rx was sent back in 2021. She states she only takes gabapentin 300mg , 2 po qhs.  ? ?I recommended she f/u with PCP about refilling until she can get in to see . She verbalized understanding.  ?

## 2022-02-17 ENCOUNTER — Encounter: Payer: Self-pay | Admitting: Neurology

## 2022-02-17 ENCOUNTER — Ambulatory Visit: Payer: Medicaid Other | Admitting: Neurology

## 2022-03-07 ENCOUNTER — Encounter: Payer: Self-pay | Admitting: Obstetrics & Gynecology

## 2022-03-07 ENCOUNTER — Other Ambulatory Visit (HOSPITAL_COMMUNITY)
Admission: RE | Admit: 2022-03-07 | Discharge: 2022-03-07 | Disposition: A | Discharge status: Home / Self Care | Payer: Medicaid Other | Source: Ambulatory Visit | Attending: Obstetrics & Gynecology | Admitting: Obstetrics & Gynecology

## 2022-03-07 ENCOUNTER — Ambulatory Visit (INDEPENDENT_AMBULATORY_CARE_PROVIDER_SITE_OTHER): Payer: Medicaid Other | Admitting: Obstetrics & Gynecology

## 2022-03-07 VITALS — BP 146/110 | HR 87 | Ht 63.0 in | Wt 254.5 lb

## 2022-03-07 DIAGNOSIS — Z01419 Encounter for gynecological examination (general) (routine) without abnormal findings: Secondary | ICD-10-CM | POA: Insufficient documentation

## 2022-03-07 DIAGNOSIS — R030 Elevated blood-pressure reading, without diagnosis of hypertension: Secondary | ICD-10-CM

## 2022-03-07 DIAGNOSIS — Z0000 Encounter for general adult medical examination without abnormal findings: Secondary | ICD-10-CM | POA: Diagnosis not present

## 2022-03-11 LAB — CYTOLOGY - PAP
Chlamydia: NEGATIVE
Comment: NEGATIVE
Comment: NEGATIVE
Comment: NEGATIVE
Comment: NEGATIVE
Comment: NORMAL
HPV 16: NEGATIVE
HPV 18 / 45: NEGATIVE
High risk HPV: POSITIVE — AB
Neisseria Gonorrhea: NEGATIVE

## 2022-04-04 IMAGING — DX DG ABDOMEN ACUTE W/ 1V CHEST
4 series · 4 of 4 positions shown · non-contrast
Comparison: 09/12/2019

CLINICAL DATA: Near syncope

EXAM:
DG ABDOMEN ACUTE WITH 1 VIEW CHEST

[chest pa]
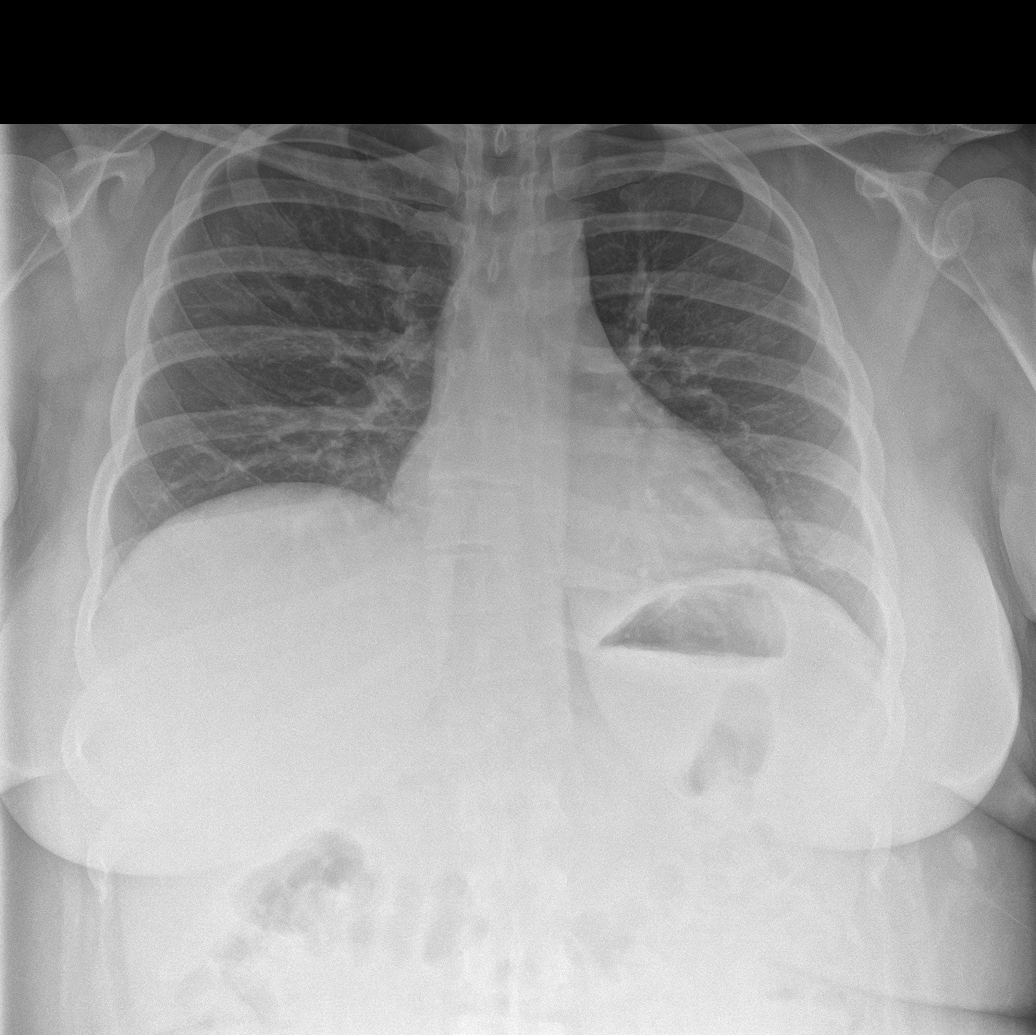

[abdomen erect]
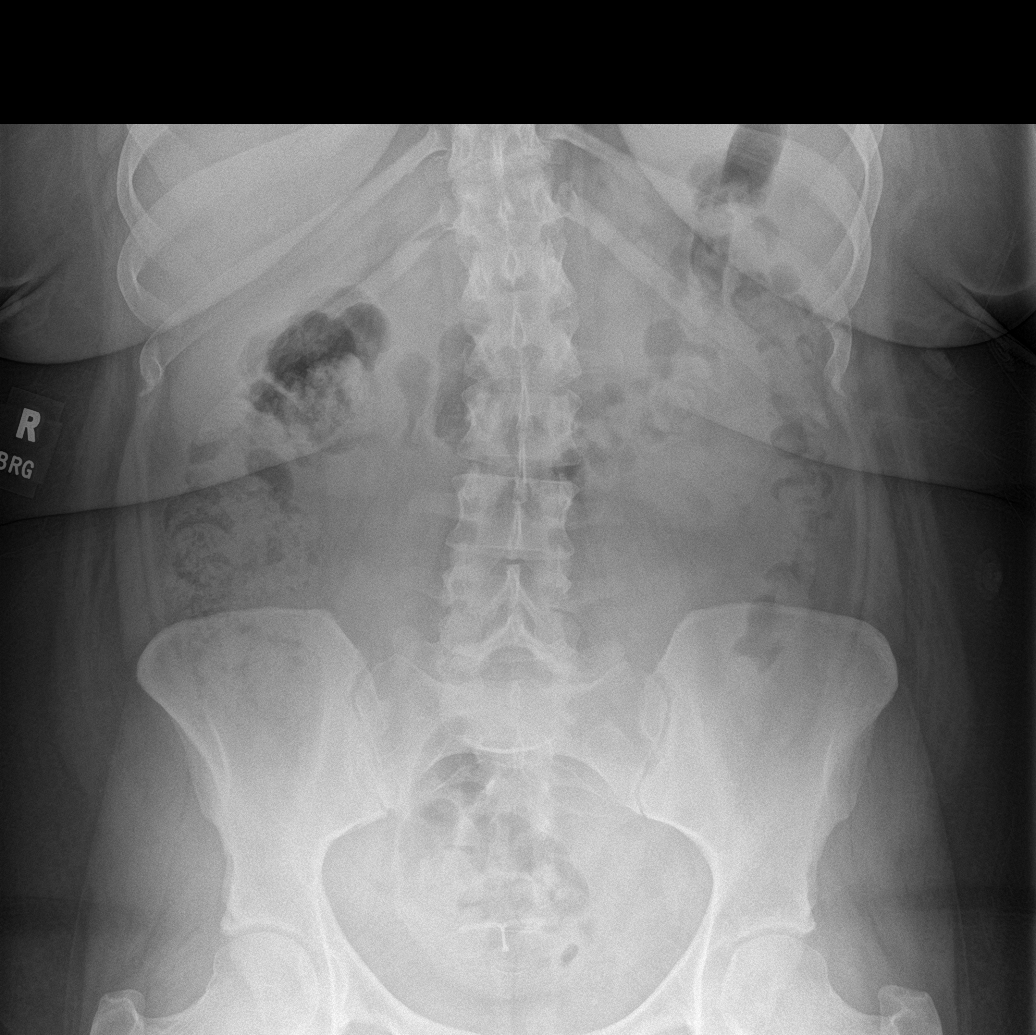

[abdomen supine (1 of 2)]
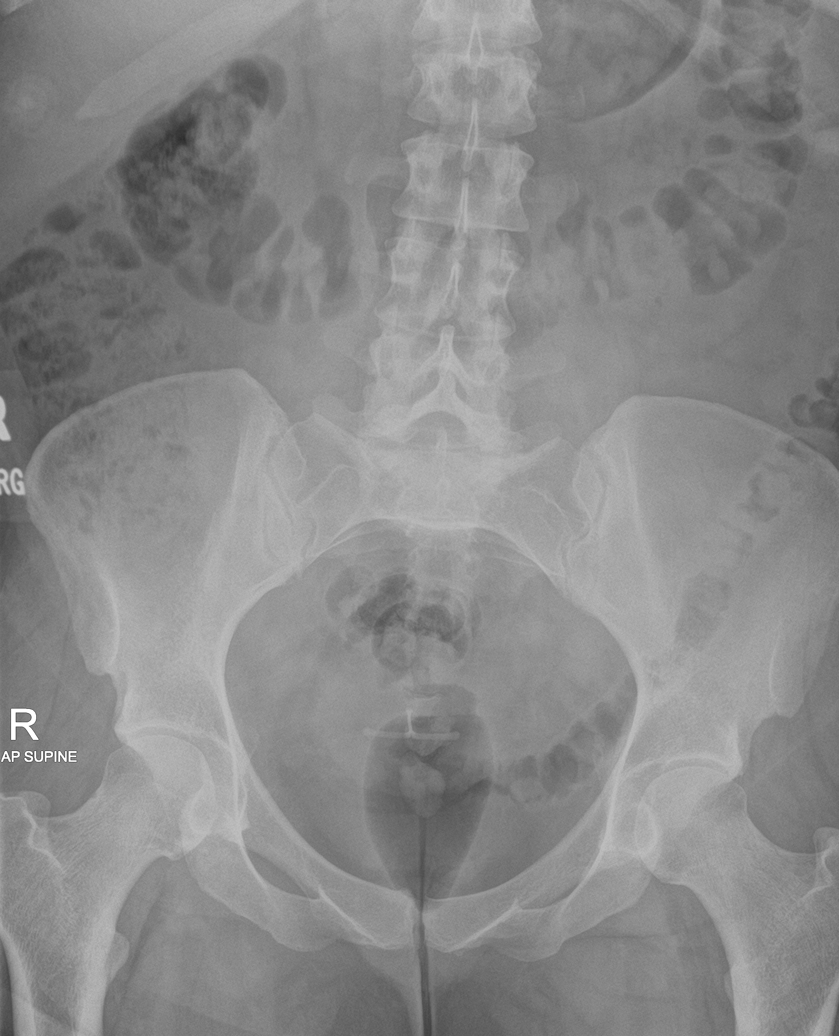

[abdomen supine (2 of 2)]
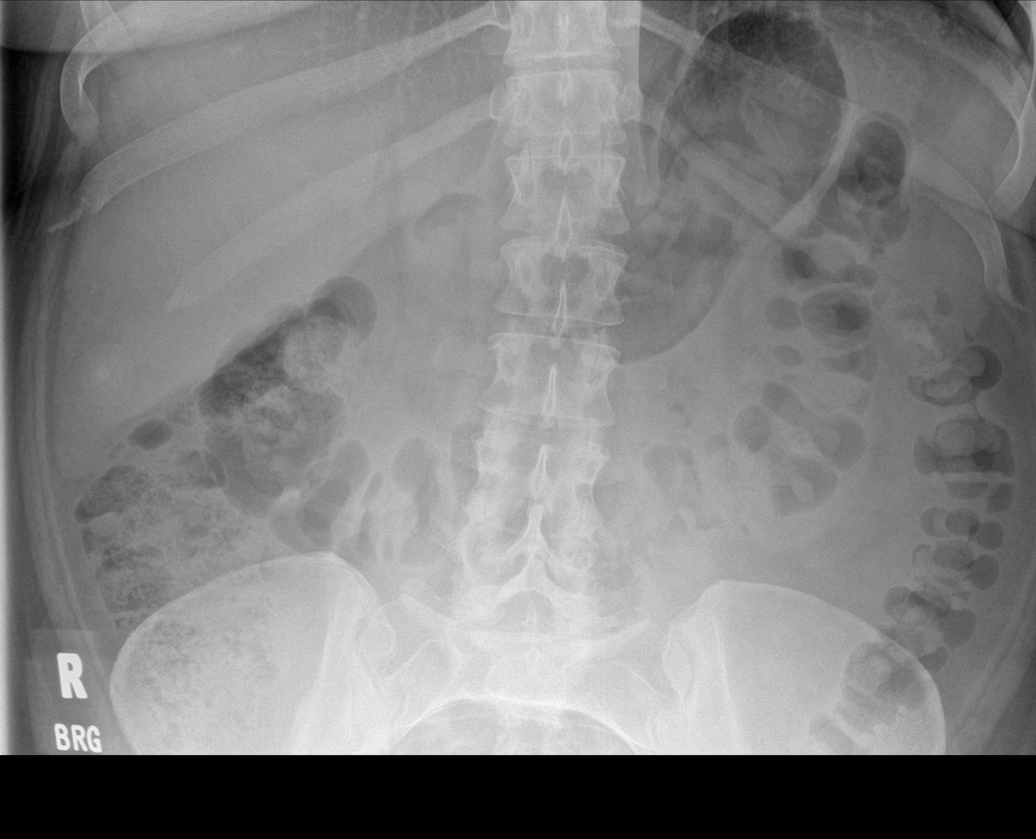

[4 of 4 positions shown; findings below may reference images not displayed]

FINDINGS: There is no evidence of dilated bowel loops or free intraperitoneal
air. No radiopaque calculi or other significant radiographic
abnormality is seen. Heart size and mediastinal contours are within
normal limits. Both lungs are clear. IUD in the pelvis. Mild to
moderate stool.
IMPRESSION: Negative abdominal radiographs.  No acute cardiopulmonary disease.

## 2022-05-02 ENCOUNTER — Ambulatory Visit (INDEPENDENT_AMBULATORY_CARE_PROVIDER_SITE_OTHER): Payer: Medicaid Other | Admitting: Women's Health

## 2022-05-02 ENCOUNTER — Other Ambulatory Visit (HOSPITAL_COMMUNITY)
Admission: RE | Admit: 2022-05-02 | Discharge: 2022-05-02 | Disposition: A | Discharge status: Home / Self Care | Payer: Medicaid Other | Source: Ambulatory Visit | Attending: Women's Health | Admitting: Women's Health

## 2022-05-02 ENCOUNTER — Encounter: Payer: Self-pay | Admitting: Women's Health

## 2022-05-02 VITALS — BP 127/84 | HR 81 | Wt 251.4 lb

## 2022-05-02 DIAGNOSIS — R102 Pelvic and perineal pain: Secondary | ICD-10-CM | POA: Diagnosis not present

## 2022-05-02 DIAGNOSIS — Z3202 Encounter for pregnancy test, result negative: Secondary | ICD-10-CM | POA: Diagnosis not present

## 2022-05-02 DIAGNOSIS — R87612 Low grade squamous intraepithelial lesion on cytologic smear of cervix (LGSIL): Secondary | ICD-10-CM

## 2022-05-02 DIAGNOSIS — N871 Moderate cervical dysplasia: Secondary | ICD-10-CM

## 2022-05-02 NOTE — Addendum Note (Signed)
Addended by: Moss Mc on: 05/02/2022 12:37 PM   Modules accepted: Orders

## 2022-05-02 NOTE — Progress Notes (Signed)
   COLPOSCOPY PROCEDURE NOTE Patient name: Christine Woodard MRN [id number]  Date of birth: 08/01/1991 Subjective Findings:   Christine Woodard is a 31 y.o. G66P1102 Caucasian female being seen today for a colposcopy. Also reports random occasional sharp vaginal pain x , lasts about a min, then resolves. Tends to have abdominal cramping first. Some pain w/ deep penetration sex. Denies abnormal discharge, itching/odor/irritation.   Indication: Abnormal pap on 03/07/22: LSIL w/ HRHPV positive: other (not 16, 18/45)  Prior cytology:  Date Result Procedure  11/05/19 NILM w/ HRHPV positive: other (not 16, 18/45) None              No LMP recorded. (Menstrual status: IUD). Contraception: IUD. Menopausal: no. Hysterectomy: no.   Smoker: no. Immunocompromised: no.   The risks and benefits were explained and informed consent was obtained, and written copy is in chart. Pertinent History Reviewed:   Reviewed past medical,surgical, social, obstetrical and family history.  Reviewed problem list, medications and allergies. Objective Findings & Procedure:   Vitals:   05/02/22 1122  BP: 127/84  Pulse: 81  Weight: 251 lb 6.4 oz (114 kg)  Body mass index is 44.53 kg/m.  No results found for this or any previous visit (from the past 24 hour(s)).   Time out was performed.  Speculum placed in the vagina, cervix fully visualized, IUD strings visible, tucked behind cx at 4 o'clock, teased out w/ cotton tipped swab, appropriate length- tucked back behind cx. SCJ: fully visualized. Cervix swabbed x 3 with acetic acid.  Acetowhitening present: Yes Cervix:  ~1cm round slightly raised growth on cervix at 1 o'clock, some light acetowhite changes around it (11-1). No mosaicism, no punctation, and no abnormal vasculature. Cervical biopsies taken at 11 & 1 o'clock and Hemostasis achieved with Monsel's solution. Vagina: vaginal colposcopy not performed Vulva: vulvar colposcopy not performed  Specimens:  2  Complications: none  Chaperone: Jobe Woodard    Colposcopic Impression & Plan:   Colposcopy findings consistent with LSIL Plan: Post biopsy instructions given, Will notify patient of results when back, and Will base plan of care on pathology results and ASCCP guidelines  Occ sharp vaginal pain> CV swab sent, could be IUD strings coming untucked-poking vagina. If CV swab neg, pain worsens/doesn't improve, let us know- can try trimming strings. Differential dx: possible endometriosis  Return in about 1 year (around 05/03/2023) for Pap & physical.  Christine Woodard CNM, [facility] 05/02/2022 11:49 AM

## 2022-05-02 NOTE — Patient Instructions (Signed)
Colposcopy, Care After  The following information offers guidance on how to care for yourself after your procedure. Your health care provider may also give you more specific instructions. If you have problems or questions, contact your health care provider. What can I expect after the procedure? If you had a colposcopy without a biopsy, you can expect to feel fine right away after your procedure. However, you may have some spotting of blood for a few days. You can return to your normal activities. If you had a colposcopy with a biopsy, it is common after the procedure to have: Soreness and mild pain. These may last for a few days. Mild vaginal bleeding or discharge that is dark-colored and grainy. This may last for a few days. The discharge may be caused by a liquid (solution) that was used during the procedure. You may need to wear a sanitary pad during this time. Spotting of blood for at least 48 hours after the procedure. Follow these instructions at home: Medicines Take over-the-counter and prescription medicines only as told by your health care provider. Talk with your health care provider about what type of over-the-counter pain medicines and prescription medicines you can start to take again. It is especially important to talk with your health care provider if you take blood thinners. Activity Avoid using douche products, using tampons, and having sex for at least 3 days after the procedure or for as long as told by your health care provider. Return to your normal activities as told by your health care provider. Ask your health care provider what activities are safe for you. General instructions Ask your health care provider if you may take baths, swim, or use a hot tub. You may take showers. If you use birth control (contraception), continue to use it. Keep all follow-up visits. This is important. Contact a health care provider if: You have a fever or chills. You faint or feel  light-headed. Get help right away if: You have heavy bleeding from your vagina or pass blood clots. Heavy bleeding is bleeding that soaks through a sanitary pad in less than 1 hour. You have vaginal discharge that is abnormal, is yellow in color, or smells bad. This could be a sign of infection. You have severe pain or cramps in your lower abdomen that do not go away with medicine. Summary If you had a colposcopy without a biopsy, you can expect to feel fine right away, but you may have some spotting of blood for a few days. You can return to your normal activities. If you had a colposcopy with a biopsy, it is common to have mild pain for a few days and spotting for 48 hours after the procedure. Avoid using douche products, using tampons, and having sex for at least 3 days after the procedure or for as long as told by your health care provider. Get help right away if you have heavy bleeding, severe pain, or signs of infection. This information is not intended to replace advice given to you by your health care provider. Make sure you discuss any questions you have with your health care provider. Document Revised: 01/24/2021 Document Reviewed: 01/24/2021 Elsevier Patient Education  2023 Elsevier Inc.  

## 2022-05-03 LAB — CERVICOVAGINAL ANCILLARY ONLY
Bacterial Vaginitis (gardnerella): POSITIVE — AB
Candida Glabrata: NEGATIVE
Candida Vaginitis: POSITIVE — AB
Chlamydia: NEGATIVE
Comment: NEGATIVE
Comment: NEGATIVE
Comment: NEGATIVE
Comment: NEGATIVE
Comment: NEGATIVE
Comment: NORMAL
Neisseria Gonorrhea: NEGATIVE
Trichomonas: NEGATIVE

## 2022-05-03 LAB — POCT URINE PREGNANCY: Preg Test, Ur: NEGATIVE

## 2022-05-03 MED ORDER — FLUCONAZOLE 150 MG PO TABS: 150.0000 mg | ORAL_TABLET | Freq: Once | ORAL | 0 refills | Status: AC

## 2022-05-03 MED ORDER — METRONIDAZOLE 500 MG PO TABS: 500.0000 mg | ORAL_TABLET | Freq: Two times a day (BID) | ORAL | 0 refills | Status: DC

## 2022-05-03 NOTE — Addendum Note (Signed)
Addended by: Cheral Marker on: 05/03/2022 01:23 PM   Modules accepted: Orders

## 2022-05-03 NOTE — Addendum Note (Signed)
Addended by: Moss Mc on: 05/03/2022 01:47 PM   Modules accepted: Orders

## 2022-05-04 LAB — SURGICAL PATHOLOGY

## 2022-05-08 ENCOUNTER — Encounter: Payer: Self-pay | Admitting: Women's Health

## 2022-05-09 ENCOUNTER — Ambulatory Visit: Payer: Medicaid Other | Admitting: Obstetrics & Gynecology

## 2022-05-09 ENCOUNTER — Encounter: Payer: Self-pay | Admitting: Obstetrics & Gynecology

## 2022-05-09 ENCOUNTER — Encounter: Payer: Self-pay | Admitting: Women's Health

## 2022-05-09 VITALS — BP 125/87 | HR 81 | Ht 63.0 in | Wt 253.4 lb

## 2022-05-09 DIAGNOSIS — R87619 Unspecified abnormal cytological findings in specimens from cervix uteri: Secondary | ICD-10-CM | POA: Insufficient documentation

## 2022-05-09 DIAGNOSIS — N871 Moderate cervical dysplasia: Secondary | ICD-10-CM | POA: Diagnosis not present

## 2022-05-09 NOTE — Progress Notes (Signed)
   GYN VISIT Patient name: Christine Woodard MRN [id number]  Date of birth: December 04, 1990 Chief Complaint:   Follow-up (Discuss colpo results)  History of Present Illness:   Christine Woodard is a 31 y.o. 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  Pt has IUD for contraception and does not desire any future pregnancies.       No LMP recorded. (Menstrual status: IUD).     03/07/2022    1:39 PM 01/21/2020    1:13 PM 11/05/2019    2:11 PM 11/05/2019    2:10 PM  Depression screen PHQ 2/9  Decreased Interest 2 2 1 1   Down, Depressed, Hopeless 1 1 1 1   PHQ - 2 Score 3 3 2 2   Altered sleeping 3 3 3    Tired, decreased energy 2 3 3    Change in appetite 2 3 3    Feeling bad or failure about yourself  1 1 1    Trouble concentrating 0 0 1   Moving slowly or fidgety/restless 1 0 1   Suicidal thoughts 0 0 0   PHQ-9 Score 12 13 14    Difficult doing work/chores  Somewhat difficult       Review of Systems:   Pertinent items are noted in HPI Denies fever/chills, dizziness, headaches, visual disturbances, fatigue, shortness of breath, chest pain, abdominal pain, vomiting, no problems with periods, bowel movements, urination, or intercourse unless otherwise stated above.  Pertinent History Reviewed:  Reviewed past medical,surgical, social, obstetrical and family history.  Reviewed problem list, medications and allergies. Physical Assessment:   Vitals:   05/09/22 1138  BP: 125/87  Pulse: 81  Weight: 253 lb 6.4 oz (114.9 kg)  Height: 5\' 3"  (1.6 m)  Body mass index is 44.89 kg/m.       Physical Examination:   General appearance: alert, well appearing, and in no distress  Psych: mood appropriate, normal affect  Skin: warm & dry   Cardiovascular: normal heart rate noted  Respiratory: normal respiratory effort, no distress   Chaperone: N/A     Assessment & Plan:  1) CIN 2 -Reviewed ASCCP guidelines -Discussed conservative vs procedural intervention- reviewed risk/benefit of each option -pt desires to proceed with cervical ablation -plan to schedule at next available. -Referral created   , DO Attending Obstetrician & Gynecologist, [facility] for [facility], [facility]

## 2022-05-10 ENCOUNTER — Encounter: Payer: Self-pay | Admitting: Obstetrics & Gynecology

## 2022-05-19 NOTE — Patient Instructions (Signed)
Christine Woodard  05/19/2022     @PREFPERIOPPHARMACY @   Your procedure is scheduled on  05/24/2022.   Report to [facility] at  0600 A.M.   Call this number if you have problems the morning of surgery:  [phone]   Remember:  Do not eat or drink after midnight.      Take these medicines the morning of surgery with A SIP OF WATER                                                 None    Do not wear jewelry, make-up or nail polish.  Do not wear lotions, powders, or perfumes, or deodorant.  Do not shave 48 hours prior to surgery.  Men may shave face and neck.  Do not bring valuables to the hospital.  [facility] is not responsible for any belongings or valuables.  Contacts, dentures or bridgework may not be worn into surgery.  Leave your suitcase in the car.  After surgery it may be brought to your room.  For patients admitted to the hospital, discharge time will be determined by your treatment team.  Patients discharged the day of surgery will not be allowed to drive home and must have someone with them for 24 hours.    Special instructions:   DO NOT smoke tobacco or vape for 24 hours before your procedure.  Please read over the following fact sheets that you were given. Coughing and Deep Breathing, Surgical Site Infection Prevention, Anesthesia Post-op Instructions, and Care and Recovery After Surgery      Cervical Laser Surgery, Care After This sheet gives you information about how to care for yourself after your procedure. Your health care provider may also give you more specific instructions. If you have problems or questions, contact your health care provider. What can I expect after the procedure? After the procedure, it is common to have: Pain or discomfort. Mild cramping. Bleeding, spotting, or brownish discharge from your vagina. Follow these instructions at home: Activity  Rest as told by your health care provider. Do not lift anything that is  heavier than 10 lb (4.5 kg), or the limit that you are told, until your health care provider says that it is safe. Do not have sex until your health care provider says it is okay. Return to your normal activities as told by your health care provider. Ask your health care provider what activities are safe for you. General instructions Take over-the-counter and prescription medicines only as told by your health care provider. Ask your health care provider if the medicine prescribed to you requires you to avoid driving or using heavy machinery. Wear sanitary pads to absorb any bleeding, spotting, and discharge. Do not douche or put anything into your vagina, including tampons, until your health care provider says it is okay. It is up to you to get the results of your procedure. Ask your health care provider, or the department that is doing the procedure, when your results will be ready. Keep all follow-up visits as told by your health care provider. This is important. Contact a health care provider if: Your pain or cramping does not improve. Your periods are more painful than usual. You do not get your period as expected. Get help right away if you have: Any symptoms  of infection, such as: A fever. Chills. Discharge that smells bad. Severe pain in your abdomen. Heavy bleeding from your vagina (more than a normal period). Vaginal bleeding with clumps of blood (blood clots). Summary After this procedure, it is common to have pain or discomfort and mild cramping. It is also common to have bleeding, spotting, or brownish discharge from your vagina. Do not have sex, douche, use tampons, or put anything in your vagina until your health care provider says it is okay. Return to your normal activities as told by your health care provider. Ask your health care provider what activities are safe for you. Take over-the-counter and prescription medicines only as told by your health care provider. You may  need to wear sanitary pads to absorb any bleeding, spotting, and discharge. This information is not intended to replace advice given to you by your health care provider. Make sure you discuss any questions you have with your health care provider. Document Revised: 02/11/2019 Document Reviewed: 02/11/2019 Elsevier Patient Education  2023 Elsevier Inc. General Anesthesia, Adult, Care After The following information offers guidance on how to care for yourself after your procedure. Your health care provider may also give you more specific instructions. If you have problems or questions, contact your health care provider. What can I expect after the procedure? After the procedure, it is common for people to: Have pain or discomfort at the IV site. Have nausea or vomiting. Have a sore throat or hoarseness. Have trouble concentrating. Feel cold or chills. Feel weak, sleepy, or tired (fatigue). Have soreness and body aches. These can affect parts of the body that were not involved in surgery. Follow these instructions at home: For the time period you were told by your health care provider:  Rest. Do not participate in activities where you could fall or become injured. Do not drive or use machinery. Do not drink alcohol. Do not take sleeping pills or medicines that cause drowsiness. Do not make important decisions or sign legal documents. Do not take care of children on your own. General instructions Drink enough fluid to keep your urine pale yellow. If you have sleep apnea, surgery and certain medicines can increase your risk for breathing problems. Follow instructions from your health care provider about wearing your sleep device: Anytime you are sleeping, including during daytime naps. While taking prescription pain medicines, sleeping medicines, or medicines that make you drowsy. Return to your normal activities as told by your health care provider. Ask your health care provider what  activities are safe for you. Take over-the-counter and prescription medicines only as told by your health care provider. Do not use any products that contain nicotine or tobacco. These products include cigarettes, chewing tobacco, and vaping devices, such as e-cigarettes. These can delay incision healing after surgery. If you need help quitting, ask your health care provider. Contact a health care provider if: You have nausea or vomiting that does not get better with medicine. You vomit every time you eat or drink. You have pain that does not get better with medicine. You cannot urinate or have bloody urine. You develop a skin rash. You have a fever. Get help right away if: You have trouble breathing. You have chest pain. You vomit blood. These symptoms may be an emergency. Get help right away. Call 911. Do not wait to see if the symptoms will go away. Do not drive yourself to the hospital. Summary After the procedure, it is common to have a sore throat, hoarseness,  nausea, vomiting, or to feel weak, sleepy, or fatigue. For the time period you were told by your health care provider, do not drive or use machinery. Get help right away if you have difficulty breathing, have chest pain, or vomit blood. These symptoms may be an emergency. This information is not intended to replace advice given to you by your health care provider. Make sure you discuss any questions you have with your health care provider. Document Revised: 11/26/2021 Document Reviewed: 11/26/2021 Elsevier Patient Education  2023 Elsevier Inc. How to Use Chlorhexidine Before Surgery Chlorhexidine gluconate (CHG) is a germ-killing (antiseptic) solution that is used to clean the skin. It can get rid of the bacteria that normally live on the skin and can keep them away for about 24 hours. To clean your skin with CHG, you may be given: A CHG solution to use in the shower or as part of a sponge bath. A prepackaged cloth that  contains CHG. Cleaning your skin with CHG may help lower the risk for infection: While you are staying in the intensive care unit of the hospital. If you have a vascular access, such as a central line, to provide short-term or long-term access to your veins. If you have a catheter to drain urine from your bladder. If you are on a ventilator. A ventilator is a machine that helps you breathe by moving air in and out of your lungs. After surgery. What are the risks? Risks of using CHG include: A skin reaction. Hearing loss, if CHG gets in your ears and you have a perforated eardrum. Eye injury, if CHG gets in your eyes and is not rinsed out. The CHG product catching fire. Make sure that you avoid smoking and flames after applying CHG to your skin. Do not use CHG: If you have a chlorhexidine allergy or have previously reacted to chlorhexidine. On babies younger than 132 months of age. How to use CHG solution Use CHG only as told by your health care provider, and follow the instructions on the label. Use the full amount of CHG as directed. Usually, this is one bottle. During a shower Follow these steps when using CHG solution during a shower (unless your health care provider gives you different instructions): Start the shower. Use your normal soap and shampoo to wash your face and hair. Turn off the shower or move out of the shower stream. Pour the CHG onto a clean washcloth. Do not use any type of brush or rough-edged sponge. Starting at your neck, lather your body down to your toes. Make sure you follow these instructions: If you will be having surgery, pay special attention to the part of your body where you will be having surgery. Scrub this area for at least 1 minute. Do not use CHG on your head or face. If the solution gets into your ears or eyes, rinse them well with water. Avoid your genital area. Avoid any areas of skin that have broken skin, cuts, or scrapes. Scrub your back and  under your arms. Make sure to wash skin folds. Let the lather sit on your skin for 1-2 minutes or as long as told by your health care provider. Thoroughly rinse your entire body in the shower. Make sure that all body creases and crevices are rinsed well. Dry off with a clean towel. Do not put any substances on your body afterward--such as powder, lotion, or perfume--unless you are told to do so by your health care provider. Only use lotions  that are recommended by the manufacturer. Put on clean clothes or pajamas. If it is the night before your surgery, sleep in clean sheets.  During a sponge bath Follow these steps when using CHG solution during a sponge bath (unless your health care provider gives you different instructions): Use your normal soap and shampoo to wash your face and hair. Pour the CHG onto a clean washcloth. Starting at your neck, lather your body down to your toes. Make sure you follow these instructions: If you will be having surgery, pay special attention to the part of your body where you will be having surgery. Scrub this area for at least 1 minute. Do not use CHG on your head or face. If the solution gets into your ears or eyes, rinse them well with water. Avoid your genital area. Avoid any areas of skin that have broken skin, cuts, or scrapes. Scrub your back and under your arms. Make sure to wash skin folds. Let the lather sit on your skin for 1-2 minutes or as long as told by your health care provider. Using a different clean, wet washcloth, thoroughly rinse your entire body. Make sure that all body creases and crevices are rinsed well. Dry off with a clean towel. Do not put any substances on your body afterward--such as powder, lotion, or perfume--unless you are told to do so by your health care provider. Only use lotions that are recommended by the manufacturer. Put on clean clothes or pajamas. If it is the night before your surgery, sleep in clean sheets. How to use  CHG prepackaged cloths Only use CHG cloths as told by your health care provider, and follow the instructions on the label. Use the CHG cloth on clean, dry skin. Do not use the CHG cloth on your head or face unless your health care provider tells you to. When washing with the CHG cloth: Avoid your genital area. Avoid any areas of skin that have broken skin, cuts, or scrapes. Before surgery Follow these steps when using a CHG cloth to clean before surgery (unless your health care provider gives you different instructions): Using the CHG cloth, vigorously scrub the part of your body where you will be having surgery. Scrub using a back-and-forth motion for 3 minutes. The area on your body should be completely wet with CHG when you are done scrubbing. Do not rinse. Discard the cloth and let the area air-dry. Do not put any substances on the area afterward, such as powder, lotion, or perfume. Put on clean clothes or pajamas. If it is the night before your surgery, sleep in clean sheets.  For general bathing Follow these steps when using CHG cloths for general bathing (unless your health care provider gives you different instructions). Use a separate CHG cloth for each area of your body. Make sure you wash between any folds of skin and between your fingers and toes. Wash your body in the following order, switching to a new cloth after each step: The front of your neck, shoulders, and chest. Both of your arms, under your arms, and your hands. Your stomach and groin area, avoiding the genitals. Your right leg and foot. Your left leg and foot. The back of your neck, your back, and your buttocks. Do not rinse. Discard the cloth and let the area air-dry. Do not put any substances on your body afterward--such as powder, lotion, or perfume--unless you are told to do so by your health care provider. Only use lotions that are recommended  by the manufacturer. Put on clean clothes or pajamas. Contact a health  care provider if: Your skin gets irritated after scrubbing. You have questions about using your solution or cloth. You swallow any chlorhexidine. Call your local poison control center (9140989352 in the [place] Get help right away if: Your eyes itch badly, or they become very red or swollen. Your skin itches badly and is red or swollen. Your hearing changes. You have trouble seeing. You have swelling or tingling in your mouth or throat. You have trouble breathing. These symptoms may represent a serious problem that is an emergency. Do not wait to see if the symptoms will go away. Get medical help right away. Call your local emergency services (911 in the [place] Do not drive yourself to the hospital. Summary Chlorhexidine gluconate (CHG) is a germ-killing (antiseptic) solution that is used to clean the skin. Cleaning your skin with CHG may help to lower your risk for infection. You may be given CHG to use for bathing. It may be in a bottle or in a prepackaged cloth to use on your skin. Carefully follow your health care provider's instructions and the instructions on the product label. Do not use CHG if you have a chlorhexidine allergy. Contact your health care provider if your skin gets irritated after scrubbing. This information is not intended to replace advice given to you by your health care provider. Make sure you discuss any questions you have with your health care provider. Document Revised: 12/27/2021 Document Reviewed: 11/09/2020 Elsevier Patient Education  2023 [employer].

## 2022-05-20 ENCOUNTER — Encounter (HOSPITAL_COMMUNITY): Payer: Self-pay | Admitting: Anesthesiology

## 2022-05-20 ENCOUNTER — Other Ambulatory Visit: Payer: Self-pay

## 2022-05-20 ENCOUNTER — Encounter (HOSPITAL_COMMUNITY)
Admission: RE | Admit: 2022-05-20 | Discharge: 2022-05-20 | Disposition: A | Discharge status: Home / Self Care | Payer: Medicaid Other | Source: Ambulatory Visit | Attending: Obstetrics & Gynecology | Admitting: Obstetrics & Gynecology

## 2022-05-20 VITALS — BP 138/92 | HR 81 | Temp 97.8°F | Resp 18 | Ht 63.0 in | Wt 253.4 lb

## 2022-05-20 DIAGNOSIS — Z01818 Encounter for other preprocedural examination: Secondary | ICD-10-CM

## 2022-05-20 DIAGNOSIS — I498 Other specified cardiac arrhythmias: Secondary | ICD-10-CM | POA: Insufficient documentation

## 2022-05-23 NOTE — H&P (Signed)
Surgery rescheduled  Myna Hidalgo, DO Attending Obstetrician & Gynecologist, [facility] for [facility], [facility]

## 2022-05-23 NOTE — Progress Notes (Signed)
Patient was c/o chest pain on and off for 2 weeks. No SOB, diaphoresis or weakness noted.  We referred her to her primary provider who made a referral to Cardiology.  The patient was seen at [facility] by her primary provider on Saturday 05/21/22.  We are waiting to hear from Dr Mayford Knife about clearance for surgery.

## 2022-05-24 ENCOUNTER — Ambulatory Visit (HOSPITAL_COMMUNITY)
Admission: RE | Admit: 2022-05-24 | Payer: Medicaid Other | Source: Home / Self Care | Admitting: Obstetrics & Gynecology

## 2022-05-24 ENCOUNTER — Encounter (HOSPITAL_COMMUNITY): Admission: RE | Payer: Self-pay | Source: Home / Self Care

## 2022-05-24 DIAGNOSIS — N871 Moderate cervical dysplasia: Secondary | ICD-10-CM

## 2022-05-24 SURGERY — ABLATION, CERVIX
Anesthesia: Choice

## 2022-05-25 ENCOUNTER — Encounter: Payer: Self-pay | Admitting: Internal Medicine

## 2022-05-25 ENCOUNTER — Ambulatory Visit: Payer: Medicaid Other | Attending: Internal Medicine | Admitting: Internal Medicine

## 2022-05-25 VITALS — BP 125/85 | HR 90 | Ht 63.0 in | Wt 254.0 lb

## 2022-05-25 DIAGNOSIS — Z6841 Body Mass Index (BMI) 40.0 and over, adult: Secondary | ICD-10-CM

## 2022-05-25 DIAGNOSIS — Z01810 Encounter for preprocedural cardiovascular examination: Secondary | ICD-10-CM

## 2022-05-25 NOTE — Progress Notes (Signed)
Cardiology Office Note:    Date:  05/25/2022   ID:  Christine Woodard, DOB 08-Aug-1991, MRN [id number]  PCP:  [facility]   [facility] Providers Cardiologist:  Alverda Skeans, MD Referring MD: [facility]   Chief Complaint/Reason for Referral: Chest pain  ASSESSMENT:    1. Preoperative cardiovascular examination   2. BMI 40.0-44.9, adult (HCC)     PLAN:    In order of problems listed above: 1.  Preoperative cardiovascular examination: The patient can do 4 METS of activity without any issues.  Her chest pain symptoms are atypical in nature.  Her EKG shows no Q waves.  She has no signs or symptoms of heart failure.  I suspect her LV function is normal.  She is at low risk for perioperative cardiovascular complication.  We will keep follow-up open-ended. 2.  Elevated BMI: We will refer to pharmacy for recommendations regarding pharmacotherapy.             Dispo:  Return if symptoms worsen or fail to improve.      Medication Adjustments/Labs and Tests Ordered: Current medicines are reviewed at length with the patient today.  Concerns regarding medicines are outlined above.  The following changes have been made:  no change   Labs/tests ordered: No orders of the defined types were placed in this encounter.   Medication Changes: No orders of the defined types were placed in this encounter.    Current medicines are reviewed at length with the patient today.  The patient does not have concerns regarding medicines.   History of Present Illness:    FOCUSED PROBLEM LIST:   1.  BMI of 44 2.  FH of coronary artery disease  The patient is a 31 y.o. female with the indicated medical history here for preoperative cardiovascular examination prior to laser ablation of her cervix.  The patient takes care of 5 children at home.  She does exercise on a treadmill.  She walks 2 miles without any limitations or exertional angina.  She reports that  occasionally she gets chest pain typically at rest.  It can last 5 minutes.  She characterizes it as a sharp pain.  It is often associated with stressful situations.  It does not seem to be worse with exertion.  Nothing seems to alleviate her exacerbate these pains may come on without any rhyme or reason.  She has fortunately required no hospitalizations or emergency room visits for these episodes.  She is trying to exercise more and lose weight however she has not made any headway in terms of significant weight loss.   Current Medications: Current Meds  Medication Sig   ferrous sulfate 325 (65 FE) MG tablet Take 325 mg by mouth 2 (two) times daily with a meal.   paragard intrauterine copper IUD IUD 1 each by Intrauterine route once.   Probiotic Product (PROBIOTIC DAILY PO) Take 1 capsule by mouth at bedtime.     Allergies:    Amoxicillin and Latex   Social History:   Social History   Tobacco Use   Smoking status: Never   Smokeless tobacco: Never  Vaping Use   Vaping Use: Never used  Substance Use Topics   Alcohol use: Yes    Comment: seldom   Drug use: No     Family Hx: Family History  Problem Relation Age of Onset   Diabetes Father    Heart disease Father    Diabetes Paternal Grandmother    Hypertension Paternal  Grandmother    Diabetes Paternal Grandfather    Breast cancer Maternal Grandmother    Cancer Maternal Grandmother    Thyroid disease Maternal Grandmother    Heart disease Maternal Grandfather    Hypotension Mother    Anemia Mother    Irritable bowel syndrome Mother    Seizures Sister    Hypertension Sister    Thyroid disease Sister    Hypertension Sister    Diabetes Sister        prediabetic   Anemia Sister      Review of Systems:   Please see the history of present illness.    All other systems reviewed and are negative.     EKGs/Labs/Other Test Reviewed:    EKG:  EKG performed May 20, 2022 that I personally reviewed demonstrates sinus  rhythm.  Prior CV studies: None available  Other studies Reviewed: Review of the additional studies/records demonstrates: No imaging data suggesting aortic atherosclerosis or coronary artery calcification  Recent Labs: 07/09/2021: BUN 8; Creatinine, Ser 0.63; Hemoglobin 14.3; Platelets 298; Potassium 3.8; Sodium 137   Recent Lipid Panel No results found for: "CHOL", "TRIG", "HDL", "LDLCALC", "LDLDIRECT"  Risk Assessment/Calculations:               Physical Exam:    VS:  BP 125/85   Pulse 90   Ht 5\' 3"  (1.6 m)   Wt 254 lb (115.2 kg)   LMP  (LMP Unknown)   SpO2 99%   BMI 44.99 kg/m    Wt Readings from Last 3 Encounters:  05/25/22 254 lb (115.2 kg)  05/20/22 253 lb 6.4 oz (114.9 kg)  05/09/22 253 lb 6.4 oz (114.9 kg)    GENERAL:  No apparent distress, AOx3 HEENT:  No carotid bruits, +2 carotid impulses, no scleral icterus CAR: RRR no murmurs, gallops, rubs, or thrills RES:  Clear to auscultation bilaterally ABD:  Soft, nontender, nondistended, positive bowel sounds x 4, obese VASC:  +2 radial pulses, +2 carotid pulses, palpable pedal pulses NEURO:  CN 2-12 grossly intact; motor and sensory grossly intact PSYCH:  No active depression or anxiety EXT:  No edema, ecchymosis, or cyanosis  Signed, 05/11/22, MD  05/25/2022 12:37 PM    [facility] [address] [place], [place], [place]  [place] Phone: [phone]; Fax: [phone]   Note:  This document was prepared using Dragon voice recognition software and may include unintentional dictation errors.

## 2022-05-25 NOTE — Patient Instructions (Signed)
Medication Instructions:  Your physician recommends that you continue on your current medications as directed. Please refer to the Current Medication list given to you today.  *If you need a refill on your cardiac medications before your next appointment, please call your pharmacy*   Follow-Up: At [facility], you and your health needs are our priority.  As part of our continuing mission to provide you with exceptional heart care, we have created designated Provider Care Teams.  These Care Teams include your primary Cardiologist (physician) and Advanced Practice Providers (APPs -  Physician Assistants and Nurse Practitioners) who all work together to provide you with the care you need, when you need it.  You have been referred to see PharmD  Follow up with Dr. Lynnette Caffey as needed.   Important Information About Sugar

## 2022-05-27 ENCOUNTER — Encounter: Payer: Self-pay | Admitting: Internal Medicine

## 2022-06-01 ENCOUNTER — Encounter: Payer: Medicaid Other | Admitting: Obstetrics & Gynecology

## 2022-06-15 ENCOUNTER — Encounter: Payer: Self-pay | Admitting: Obstetrics & Gynecology

## 2022-06-16 ENCOUNTER — Encounter: Payer: Self-pay | Admitting: Obstetrics & Gynecology

## 2022-06-29 ENCOUNTER — Ambulatory Visit: Payer: Medicaid Other

## 2022-06-29 NOTE — Patient Instructions (Signed)
[place]  06/29/2022     @PREFPERIOPPHARMACY @   Your procedure is scheduled on  07/05/2022.   Report to [facility] at  [phone] A.M.   Call this number if you have problems the morning of surgery:  [phone]  If you experience any cold or flu symptoms such as cough, fever, chills, shortness of breath, etc. between now and your scheduled surgery, please notify us at the above number.   Remember:  Do not eat or drink after midnight.      Take these medicines the morning of surgery with A SIP OF WATER                                                 None.     Do not wear jewelry, make-up or nail polish.  Do not wear lotions, powders, or perfumes, or deodorant.  Do not shave 48 hours prior to surgery.  Men may shave face and neck.  Do not bring valuables to the hospital.  [facility] is not responsible for any belongings or valuables.  Contacts, dentures or bridgework may not be worn into surgery.  Leave your suitcase in the car.  After surgery it may be brought to your room.  For patients admitted to the hospital, discharge time will be determined by your treatment team.  Patients discharged the day of surgery will not be allowed to drive home and must have someone with them for 24 hours.    Special instructions:   DO NOT smoke tobacco or vape for 24 hours before your procedure.  Please read over the following fact sheets that you were given. Coughing and Deep Breathing, Surgical Site Infection Prevention, Anesthesia Post-op Instructions, and Care and Recovery After Surgery      Cervical Laser Surgery, Care After This sheet gives you information about how to care for yourself after your procedure. Your health care provider may also give you more specific instructions. If you have problems or questions, contact your health care provider. What can I expect after the procedure? After the procedure, it is common to have: Pain or discomfort. Mild  cramping. Bleeding, spotting, or brownish discharge from your vagina. Follow these instructions at home: Activity  Rest as told by your health care provider. Do not lift anything that is heavier than 10 lb (4.5 kg), or the limit that you are told, until your health care provider says that it is safe. Do not have sex until your health care provider says it is okay. Return to your normal activities as told by your health care provider. Ask your health care provider what activities are safe for you. General instructions Take over-the-counter and prescription medicines only as told by your health care provider. Ask your health care provider if the medicine prescribed to you requires you to avoid driving or using heavy machinery. Wear sanitary pads to absorb any bleeding, spotting, and discharge. Do not douche or put anything into your vagina, including tampons, until your health care provider says it is okay. It is up to you to get the results of your procedure. Ask your health care provider, or the department that is doing the procedure, when your results will be ready. Keep all follow-up visits as told by your health care provider. This is important. Contact a health care provider  if: Your pain or cramping does not improve. Your periods are more painful than usual. You do not get your period as expected. Get help right away if you have: Any symptoms of infection, such as: A fever. Chills. Discharge that smells bad. Severe pain in your abdomen. Heavy bleeding from your vagina (more than a normal period). Vaginal bleeding with clumps of blood (blood clots). Summary After this procedure, it is common to have pain or discomfort and mild cramping. It is also common to have bleeding, spotting, or brownish discharge from your vagina. Do not have sex, douche, use tampons, or put anything in your vagina until your health care provider says it is okay. Return to your normal activities as told by  your health care provider. Ask your health care provider what activities are safe for you. Take over-the-counter and prescription medicines only as told by your health care provider. You may need to wear sanitary pads to absorb any bleeding, spotting, and discharge. This information is not intended to replace advice given to you by your health care provider. Make sure you discuss any questions you have with your health care provider. Document Revised: 02/11/2019 Document Reviewed: 02/11/2019 Elsevier Patient Education  2023 [employer] Inc. General Anesthesia, Adult, Care After The following information offers guidance on how to care for yourself after your procedure. Your health care provider may also give you more specific instructions. If you have problems or questions, contact your health care provider. What can I expect after the procedure? After the procedure, it is common for people to: Have pain or discomfort at the IV site. Have nausea or vomiting. Have a sore throat or hoarseness. Have trouble concentrating. Feel cold or chills. Feel weak, sleepy, or tired (fatigue). Have soreness and body aches. These can affect parts of the body that were not involved in surgery. Follow these instructions at home: For the time period you were told by your health care provider:  Rest. Do not participate in activities where you could fall or become injured. Do not drive or use machinery. Do not drink alcohol. Do not take sleeping pills or medicines that cause drowsiness. Do not make important decisions or sign legal documents. Do not take care of children on your own. General instructions Drink enough fluid to keep your urine pale yellow. If you have sleep apnea, surgery and certain medicines can increase your risk for breathing problems. Follow instructions from your health care provider about wearing your sleep device: Anytime you are sleeping, including during daytime naps. While taking  prescription pain medicines, sleeping medicines, or medicines that make you drowsy. Return to your normal activities as told by your health care provider. Ask your health care provider what activities are safe for you. Take over-the-counter and prescription medicines only as told by your health care provider. Do not use any products that contain nicotine or tobacco. These products include cigarettes, chewing tobacco, and vaping devices, such as e-cigarettes. These can delay incision healing after surgery. If you need help quitting, ask your health care provider. Contact a health care provider if: You have nausea or vomiting that does not get better with medicine. You vomit every time you eat or drink. You have pain that does not get better with medicine. You cannot urinate or have bloody urine. You develop a skin rash. You have a fever. Get help right away if: You have trouble breathing. You have chest pain. You vomit blood. These symptoms may be an emergency. Get help right away. Call  911. Do not wait to see if the symptoms will go away. Do not drive yourself to the hospital. Summary After the procedure, it is common to have a sore throat, hoarseness, nausea, vomiting, or to feel weak, sleepy, or fatigue. For the time period you were told by your health care provider, do not drive or use machinery. Get help right away if you have difficulty breathing, have chest pain, or vomit blood. These symptoms may be an emergency. This information is not intended to replace advice given to you by your health care provider. Make sure you discuss any questions you have with your health care provider. Document Revised: 11/26/2021 Document Reviewed: 11/26/2021 Elsevier Patient Education  2023 [employer].

## 2022-07-01 ENCOUNTER — Encounter (HOSPITAL_COMMUNITY)
Admission: RE | Admit: 2022-07-01 | Discharge: 2022-07-01 | Disposition: A | Discharge status: Home / Self Care | Payer: Self-pay | Source: Ambulatory Visit | Attending: Obstetrics & Gynecology | Admitting: Obstetrics & Gynecology

## 2022-07-01 ENCOUNTER — Encounter (HOSPITAL_COMMUNITY): Payer: Self-pay

## 2022-07-01 VITALS — BP 141/91 | HR 95 | Temp 97.8°F | Resp 18 | Ht 63.0 in | Wt 254.0 lb

## 2022-07-01 DIAGNOSIS — Z01818 Encounter for other preprocedural examination: Secondary | ICD-10-CM

## 2022-07-01 DIAGNOSIS — N871 Moderate cervical dysplasia: Secondary | ICD-10-CM | POA: Insufficient documentation

## 2022-07-01 DIAGNOSIS — Z01812 Encounter for preprocedural laboratory examination: Secondary | ICD-10-CM | POA: Insufficient documentation

## 2022-07-01 LAB — CBC
HCT: 42.6 % (ref 36.0–46.0)
Hemoglobin: 14.3 g/dL (ref 12.0–15.0)
MCH: 31.2 pg (ref 26.0–34.0)
MCHC: 33.6 g/dL (ref 30.0–36.0)
MCV: 92.8 fL (ref 80.0–100.0)
Platelets: 290 10*3/uL (ref 150–400)
RBC: 4.59 MIL/uL (ref 3.87–5.11)
RDW: 12.6 % (ref 11.5–15.5)
WBC: 8.9 10*3/uL (ref 4.0–10.5)
nRBC: 0 % (ref 0.0–0.2)

## 2022-07-01 LAB — POCT PREGNANCY, URINE: Preg Test, Ur: NEGATIVE

## 2022-07-03 NOTE — H&P (Signed)
[facility] Attending History and Physical  Christine Woodard is a 31 y.o. 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  In review, recent colposcopy completed on 05/02/2022 showed CIN 2 and CIN 1 Prior pap 02/2022 LSIL, HPV+ other  IUD for contraception- no menses  Today she notes no acute complaints. Denies any abnormal vaginal discharge, fevers, chills, sweats, dysuria, nausea, vomiting, other GI or GU symptoms or other general symptoms.  Past Medical History:  Diagnosis Date   Back pain    Eclampsia    Headache(784.0)    Seizures (HCC)    during pregnancy   History reviewed. No pertinent surgical history. OB History  Gravida Para Term Preterm AB Living  2 2 1 1  0 2  SAB IAB Ectopic Multiple Live Births  0 0 0 0 2    # Outcome Date GA Lbr Len/2nd Weight Sex Delivery Anes PTL Lv  2 Term 03/15/18 [redacted]w[redacted]d 04:00 / 00:37 3575 g F Vag-Spont EPI  LIV  1 Preterm 08/11/12 [redacted]w[redacted]d 01:45 / 00:34 2625 g M Vag-Vacuum EPI  LIV     Birth Comments: none  Patient denies any other pertinent gynecologic issues.  No current facility-administered medications on file prior to encounter.   Current Outpatient Medications on File Prior to Encounter  Medication Sig Dispense Refill   acetaminophen (TYLENOL) 500 MG tablet Take 500-1,000 mg by mouth every 6 (six) hours as needed (pain.).     aspirin-acetaminophen-caffeine (EXCEDRIN MIGRAINE) 250-250-65 MG tablet Take 1-2 tablets by mouth 2 (two) times daily as needed for headache.     ferrous sulfate 325 (65 FE) MG tablet Take 325 mg by mouth every evening.     paragard intrauterine copper IUD IUD 1 each by Intrauterine route once.     Allergies  Allergen Reactions   Amoxicillin Hives and Swelling    Swelling to throat Has patient had a PCN reaction causing immediate rash, facial/tongue/throat swelling, SOB or lightheadedness with hypotension: Yes Has patient had a PCN reaction causing severe rash  involving mucus membranes or skin necrosis: No Has patient had a PCN reaction that required hospitalization: No Has patient had a PCN reaction occurring within the last 10 years: No If all of the above answers are "NO", then may proceed with Cephalosporin use.    Latex Rash    Social History:   reports that she has never smoked. She has never used smokeless tobacco. She reports current alcohol use. She reports that she does not use drugs. Family History  Problem Relation Age of Onset   Diabetes Father    Heart disease Father    Diabetes Paternal Grandmother    Hypertension Paternal Grandmother    Diabetes Paternal Grandfather    Breast cancer Maternal Grandmother    Cancer Maternal Grandmother    Thyroid disease Maternal Grandmother    Heart disease Maternal Grandfather    Hypotension Mother    Anemia Mother    Irritable bowel syndrome Mother    Seizures Sister    Hypertension Sister    Thyroid disease Sister    Hypertension Sister    Diabetes Sister        prediabetic   Anemia Sister     Review of Systems: Pertinent items noted in HPI and remainder of comprehensive ROS otherwise negative.  PHYSICAL EXAM: Blood pressure (!) 154/108, pulse (!) 104, temperature 97.9 F (36.6 C), resp. rate 18, height 5\' 3"  (1.6 m), weight 112 kg, SpO2 99 %.  CONSTITUTIONAL: Well-developed, well-nourished female in no acute distress.  SKIN: Skin is warm and dry. No rash noted. Not diaphoretic. No erythema. No pallor. NEUROLOGIC: Alert and oriented to person, place, and time.  PSYCHIATRIC: Notes feeling anxious this am. CARDIOVASCULAR: Normal heart rate noted, regular rhythm RESPIRATORY: Effort and breath sounds normal, no problems with respiration noted ABDOMEN: Soft, nontender, nondistended. PELVIC: deferred MUSCULOSKELETAL: no calf tenderness bilaterally EXT: no edema bilaterally, SCDs in place  Labs: Results for orders placed or performed during the hospital encounter of 07/01/22  (from the past 336 hour(s))  CBC   Collection Time: 07/01/22  1:02 PM  Result Value Ref Range   WBC 8.9 4.0 - 10.5 K/uL   RBC 4.59 3.87 - 5.11 MIL/uL   Hemoglobin 14.3 12.0 - 15.0 g/dL   HCT 42.6 36.0 - 46.0 %   MCV 92.8 80.0 - 100.0 fL   MCH 31.2 26.0 - 34.0 pg   MCHC 33.6 30.0 - 36.0 g/dL   RDW 12.6 11.5 - 15.5 %   Platelets 290 150 - 400 K/uL   nRBC 0.0 0.0 - 0.2 %  Pregnancy, urine POC   Collection Time: 07/01/22  1:07 PM  Result Value Ref Range   Preg Test, Ur NEGATIVE NEGATIVE    Assessment: CIN 2  Plan: Cervical ablation -NPO -LR @ 125cc/hr -SCDs to OR -Risk/benefits and alternatives reviewed with the patient including but not limited to risk of bleeding, infection and injury such as shortened cervix.  Questions and concerns were addressed and pt desires to proceed  Janyth Pupa, DO Attending [place], [facility] for [facility], [place]

## 2022-07-05 ENCOUNTER — Encounter (HOSPITAL_COMMUNITY)
Admission: RE | Disposition: A | Discharge status: Home / Self Care | Payer: Self-pay | Source: Home / Self Care | Attending: Obstetrics & Gynecology

## 2022-07-05 ENCOUNTER — Ambulatory Visit (HOSPITAL_COMMUNITY): Payer: Self-pay | Admitting: Anesthesiology

## 2022-07-05 ENCOUNTER — Ambulatory Visit (HOSPITAL_BASED_OUTPATIENT_CLINIC_OR_DEPARTMENT_OTHER): Payer: Self-pay | Admitting: Anesthesiology

## 2022-07-05 ENCOUNTER — Encounter (HOSPITAL_COMMUNITY): Payer: Self-pay | Admitting: Obstetrics & Gynecology

## 2022-07-05 ENCOUNTER — Ambulatory Visit (HOSPITAL_COMMUNITY)
Admission: RE | Admit: 2022-07-05 | Discharge: 2022-07-05 | Disposition: A | Discharge status: Home / Self Care | Payer: Self-pay | Attending: Obstetrics & Gynecology | Admitting: Obstetrics & Gynecology

## 2022-07-05 ENCOUNTER — Other Ambulatory Visit: Payer: Self-pay

## 2022-07-05 DIAGNOSIS — Z01818 Encounter for other preprocedural examination: Secondary | ICD-10-CM

## 2022-07-05 DIAGNOSIS — N871 Moderate cervical dysplasia: Secondary | ICD-10-CM

## 2022-07-05 DIAGNOSIS — Z6841 Body Mass Index (BMI) 40.0 and over, adult: Secondary | ICD-10-CM

## 2022-07-05 DIAGNOSIS — R519 Headache, unspecified: Secondary | ICD-10-CM | POA: Insufficient documentation

## 2022-07-05 DIAGNOSIS — R569 Unspecified convulsions: Secondary | ICD-10-CM

## 2022-07-05 HISTORY — PX: CERVICAL ABLATION: SHX5771

## 2022-07-05 SURGERY — ABLATION, CERVIX
Anesthesia: General | Site: Vagina

## 2022-07-05 MED ORDER — DIPHENHYDRAMINE HCL 50 MG/ML IJ SOLN
INTRAMUSCULAR | Status: DC | PRN
  Administered 2022-07-05: 12.5 mg via INTRAVENOUS

## 2022-07-05 MED ORDER — OXYCODONE HCL 5 MG PO TABS: 5.0000 mg | ORAL_TABLET | Freq: Once | ORAL | Status: DC | PRN

## 2022-07-05 MED ORDER — DEXAMETHASONE SODIUM PHOSPHATE 10 MG/ML IJ SOLN
INTRAMUSCULAR | Status: DC | PRN
  Administered 2022-07-05: 10 mg via INTRAVENOUS

## 2022-07-05 MED ORDER — DEXMEDETOMIDINE HCL IN NACL 80 MCG/20ML IV SOLN
INTRAVENOUS | Status: DC | PRN
  Administered 2022-07-05 (×2): 4 ug via BUCCAL

## 2022-07-05 MED ORDER — LACTATED RINGERS IV SOLN: INTRAVENOUS | Status: DC

## 2022-07-05 MED ORDER — KETOROLAC TROMETHAMINE 30 MG/ML IJ SOLN
INTRAMUSCULAR | Status: AC
  Filled 2022-07-05: qty 1

## 2022-07-05 MED ORDER — MIDAZOLAM HCL 5 MG/5ML IJ SOLN
INTRAMUSCULAR | Status: DC | PRN
  Administered 2022-07-05: 2 mg via INTRAVENOUS

## 2022-07-05 MED ORDER — CHLORHEXIDINE GLUCONATE 0.12 % MT SOLN
15.0000 mL | Freq: Once | OROMUCOSAL | Status: AC
  Administered 2022-07-05: 15 mL via OROMUCOSAL

## 2022-07-05 MED ORDER — FENTANYL CITRATE PF 50 MCG/ML IJ SOSY: 25.0000 ug | PREFILLED_SYRINGE | INTRAMUSCULAR | Status: DC | PRN

## 2022-07-05 MED ORDER — FERRIC SUBSULFATE 259 MG/GM EX SOLN
CUTANEOUS | Status: AC
  Filled 2022-07-05: qty 8

## 2022-07-05 MED ORDER — FENTANYL CITRATE (PF) 100 MCG/2ML IJ SOLN
INTRAMUSCULAR | Status: AC
  Filled 2022-07-05: qty 2

## 2022-07-05 MED ORDER — SCOPOLAMINE 1 MG/3DAYS TD PT72
MEDICATED_PATCH | TRANSDERMAL | Status: AC
  Filled 2022-07-05: qty 1

## 2022-07-05 MED ORDER — FENTANYL CITRATE (PF) 100 MCG/2ML IJ SOLN
INTRAMUSCULAR | Status: DC | PRN
  Administered 2022-07-05 (×2): 50 ug via INTRAVENOUS

## 2022-07-05 MED ORDER — DIPHENHYDRAMINE HCL 50 MG/ML IJ SOLN
INTRAMUSCULAR | Status: AC
  Filled 2022-07-05: qty 1

## 2022-07-05 MED ORDER — FERRIC SUBSULFATE 259 MG/GM EX SOLN
CUTANEOUS | Status: DC | PRN
  Administered 2022-07-05: 1

## 2022-07-05 MED ORDER — ORAL CARE MOUTH RINSE: 15.0000 mL | Freq: Once | OROMUCOSAL | Status: AC

## 2022-07-05 MED ORDER — PROPOFOL 10 MG/ML IV BOLUS
INTRAVENOUS | Status: AC
  Filled 2022-07-05: qty 20

## 2022-07-05 MED ORDER — ONDANSETRON HCL 4 MG/2ML IJ SOLN
INTRAMUSCULAR | Status: DC | PRN
  Administered 2022-07-05: 4 mg via INTRAVENOUS

## 2022-07-05 MED ORDER — WATER FOR IRRIGATION, STERILE IR SOLN
Status: DC | PRN
  Administered 2022-07-05: 1000 mL via SURGICAL_CAVITY

## 2022-07-05 MED ORDER — MIDAZOLAM HCL 2 MG/2ML IJ SOLN
INTRAMUSCULAR | Status: AC
  Filled 2022-07-05: qty 2

## 2022-07-05 MED ORDER — LIDOCAINE 2% (20 MG/ML) 5 ML SYRINGE
INTRAMUSCULAR | Status: DC | PRN
  Administered 2022-07-05: 100 mg via INTRAVENOUS

## 2022-07-05 MED ORDER — ACETIC ACID 5 % SOLN
Status: DC | PRN
  Administered 2022-07-05: 1 via TOPICAL

## 2022-07-05 MED ORDER — SCOPOLAMINE 1 MG/3DAYS TD PT72
MEDICATED_PATCH | TRANSDERMAL | Status: DC | PRN
  Administered 2022-07-05: 1 via TRANSDERMAL

## 2022-07-05 MED ORDER — PROPOFOL 10 MG/ML IV BOLUS
INTRAVENOUS | Status: DC | PRN
  Administered 2022-07-05: 200 mg via INTRAVENOUS

## 2022-07-05 MED ORDER — DEXAMETHASONE SODIUM PHOSPHATE 10 MG/ML IJ SOLN
INTRAMUSCULAR | Status: AC
  Filled 2022-07-05: qty 1

## 2022-07-05 MED ORDER — ONDANSETRON HCL 4 MG/2ML IJ SOLN
INTRAMUSCULAR | Status: AC
  Filled 2022-07-05: qty 2

## 2022-07-05 MED ORDER — ONDANSETRON HCL 4 MG/2ML IJ SOLN
4.0000 mg | Freq: Once | INTRAMUSCULAR | Status: AC | PRN
  Administered 2022-07-05: 4 mg via INTRAVENOUS
  Filled 2022-07-05: qty 2

## 2022-07-05 MED ORDER — OXYCODONE HCL 5 MG/5ML PO SOLN: 5.0000 mg | Freq: Once | ORAL | Status: DC | PRN

## 2022-07-05 MED ORDER — KETOROLAC TROMETHAMINE 30 MG/ML IJ SOLN
INTRAMUSCULAR | Status: DC | PRN
  Administered 2022-07-05: 30 mg via INTRAVENOUS

## 2022-07-05 MED ORDER — DEXMEDETOMIDINE HCL IN NACL 80 MCG/20ML IV SOLN
INTRAVENOUS | Status: AC
  Filled 2022-07-05: qty 20

## 2022-07-05 SURGICAL SUPPLY — 22 items
BAG HAMPER (MISCELLANEOUS) ×1 IMPLANT
CLOTH BEACON ORANGE TIMEOUT ST (SAFETY) ×2 IMPLANT
COVER LIGHT HANDLE STERIS (MISCELLANEOUS) ×4 IMPLANT
GAUZE 4X4 16PLY ~~LOC~~+RFID DBL (SPONGE) ×1 IMPLANT
GLOVE BIO SURGEON STRL SZ 6.5 (GLOVE) ×1 IMPLANT
GLOVE BIOGEL PI IND STRL 7.0 (GLOVE) ×3 IMPLANT
GOWN STRL REUS W/ TWL LRG LVL3 (GOWN DISPOSABLE) ×1 IMPLANT
GOWN STRL REUS W/TWL LRG LVL3 (GOWN DISPOSABLE) ×2 IMPLANT
KIT TURNOVER KIT A (KITS) ×1 IMPLANT
LASER FIBER 1000M SMARTSCOPE (Laser) ×1 IMPLANT
MANIFOLD NEPTUNE II (INSTRUMENTS) ×1 IMPLANT
MARKER SKIN DUAL TIP RULER LAB (MISCELLANEOUS) ×1 IMPLANT
PACK BASIC III (CUSTOM PROCEDURE TRAY) ×1
PACK SRG BSC III STRL LF ECLPS (CUSTOM PROCEDURE TRAY) ×1 IMPLANT
PAD ARMBOARD 7.5X6 YLW CONV (MISCELLANEOUS) ×1 IMPLANT
SCOPETTES 8  STERILE (MISCELLANEOUS) ×1
SCOPETTES 8 STERILE (MISCELLANEOUS) ×1 IMPLANT
SET BASIN LINEN APH (SET/KITS/TRAYS/PACK) ×1 IMPLANT
SHEET LAVH (DRAPES) ×1 IMPLANT
SWAB PROCTOSCOPIC (MISCELLANEOUS) ×1 IMPLANT
TUBING SMOKE EVAC CO2 (TUBING) ×1 IMPLANT
WATER STERILE IRR 1000ML POUR (IV SOLUTION) ×1 IMPLANT

## 2022-07-05 NOTE — Anesthesia Procedure Notes (Signed)
Procedure Name: LMA Insertion Date/Time: 07/05/2022 7:40 AM  Performed by: Myna Bright, CRNAPre-anesthesia Checklist: Patient identified, Emergency Drugs available, Suction available and Patient being monitored Patient Re-evaluated:Patient Re-evaluated prior to induction Oxygen Delivery Method: Circle system utilized Preoxygenation: Pre-oxygenation with 100% oxygen Induction Type: IV induction Ventilation: Mask ventilation without difficulty LMA: LMA inserted LMA Size: 4.0 Number of attempts: 1 Placement Confirmation: breath sounds checked- equal and bilateral and positive ETCO2 Tube secured with: Tape Dental Injury: Teeth and Oropharynx as per pre-operative assessment

## 2022-07-05 NOTE — Discharge Instructions (Addendum)
HOME INSTRUCTIONS  Please note any unusual or excessive bleeding, pain, swelling. Mild dizziness or drowsiness are normal for about 24 hours after surgery.  You may see yellow, black or grey discharge, this is normal from the medicine that was placed on your cervix to help with bleeding.   Shower when comfortable  Restrictions: No driving for 24 hours or while taking pain medications.  Activity:  No heavy lifting (> 10 lbs), nothing in vagina (no tampons, douching, or intercourse) x 4 weeks; no tub baths for 4 weeks Vaginal spotting is expected but if your bleeding is heavy, period like,  please call the office   Diet:  You may return to your regular diet.  Do not eat large meals.  Eat small frequent meals throughout the day.  Continue to drink a good amount of water at least 6-8 glasses of water per day, hydration is very important for the healing process.  Pain Management: Take over the counter tylenol or ibuprofen as needed for pain.  You can either take one or alternate between the two medications for pain management.  You may also use a heating pack as needed.    Alcohol -- Avoid for 24 hours and while taking pain medications.  Nausea: Take sips of ginger ale or soda  Fever -- Call physician if temperature over 101 degrees  Follow up:  If you do not already have a follow up appointment scheduled, please call the office at [phone].  If you experience fever (a temperature greater than 100.4), pain unrelieved by pain medication, shortness of breath, swelling of a single leg, or any other symptoms which are concerning to you please the office immediately.

## 2022-07-05 NOTE — Transfer of Care (Signed)
Immediate Anesthesia Transfer of Care Note  Patient: Christine Woodard  Procedure(s) Performed: CERVICAL ABLATION (Vagina )  Patient Location: PACU  Anesthesia Type:General  Level of Consciousness: awake, alert , oriented and patient cooperative  Airway & Oxygen Therapy: Patient Spontanous Breathing and Patient connected to nasal cannula oxygen  Post-op Assessment: Report given to RN, Post -op Vital signs reviewed and stable and Patient moving all extremities  Post vital signs: Reviewed and stable   Last Vitals:  Vitals Value Taken Time  BP    Temp    Pulse 93 07/05/22 0818  Resp 20 07/05/22 0818  SpO2 99 % 07/05/22 0818  Vitals shown include unvalidated device data.  Last Pain:  Vitals:   07/05/22 3291  PainSc: 0-No pain         Complications: No notable events documented.

## 2022-07-05 NOTE — Anesthesia Preprocedure Evaluation (Signed)
Anesthesia Evaluation  Patient identified by MRN, date of birth, ID band Patient awake    Reviewed: Allergy & Precautions, H&P , NPO status , Patient's Chart, lab work & pertinent test results, reviewed documented beta blocker date and time   Airway Mallampati: II  TM Distance: >3 FB Neck ROM: full    Dental no notable dental hx.    Pulmonary neg pulmonary ROS,    Pulmonary exam normal breath sounds clear to auscultation       Cardiovascular Exercise Tolerance: Good negative cardio ROS   Rhythm:regular Rate:Normal     Neuro/Psych  Headaches, Seizures -, Well Controlled,  negative psych ROS   GI/Hepatic negative GI ROS, Neg liver ROS,   Endo/Other  Morbid obesity  Renal/GU negative Renal ROS  negative genitourinary   Musculoskeletal   Abdominal   Peds  Hematology negative hematology ROS (+)   Anesthesia Other Findings   Reproductive/Obstetrics negative OB ROS                             Anesthesia Physical Anesthesia Plan  ASA: 3  Anesthesia Plan: General   Post-op Pain Management:    Induction:   PONV Risk Score and Plan: Ondansetron  Airway Management Planned:   Additional Equipment:   Intra-op Plan:   Post-operative Plan:   Informed Consent: I have reviewed the patients History and Physical, chart, labs and discussed the procedure including the risks, benefits and alternatives for the proposed anesthesia with the patient or authorized representative who has indicated his/her understanding and acceptance.     Dental Advisory Given  Plan Discussed with: CRNA  Anesthesia Plan Comments:         Anesthesia Quick Evaluation

## 2022-07-05 NOTE — Op Note (Signed)
Preoperative Diagnosis:  High Grade Squamous Intraepithelial lesion, adequate colposcopy  Postoperative Diagnosis:  Same as above  Procedure:  Laser ablation of the cervix  Surgeon:  Dr. Janyth Pupa  Anaesthesia:  Laryngeal Mask Airway  Findings:  Patient had an abnormal pap smear which was evaluated in the office with colposcopy and directed biopsies.  Pathology report returned as high Grade SIL- CIN 2 The colposcopy was adequate.  As a result, the patient is admitted for laser ablation of the cervix.  Description of Note:  Patient was taken to the OR and placed in the supine position where she underwent laryngeal mask airway anaesthesia.  She was placed in the dorsal lithotomy position.  She was draped for laser.  Graves speculum was placed and 3% acetic acid used and the laser microscope employed to perform colposcopy which confirmed the office findings.  Laser was used on typical cervical settings and used to vaporized the squamocolumnar junction to  depth of  5-7 mm peripherally and 7-9 mm centrally.  Surgical margin of several mm was employed beyond the acetowhite epithelium.  Care was taken to try to avoid the IUD strings.  Hemostasis was achieved with the laser and Monsel's solution.  Patient was awakened from anaesthesia in good stable condition and all counts were correct.   Janyth Pupa, DO Attending [place], [facility] for [employer], [place]

## 2022-07-07 NOTE — Anesthesia Postprocedure Evaluation (Signed)
Anesthesia Post Note  Patient: KEDRA MCGLADE  Procedure(s) Performed: CERVICAL ABLATION (Vagina )  Patient location during evaluation: Phase II Anesthesia Type: General Level of consciousness: awake Pain management: pain level controlled Vital Signs Assessment: post-procedure vital signs reviewed and stable Respiratory status: spontaneous breathing and respiratory function stable Cardiovascular status: blood pressure returned to baseline and stable Postop Assessment: no headache and no apparent nausea or vomiting Anesthetic complications: no Comments: Late entry   No notable events documented.   Last Vitals:  Vitals:   07/05/22 0858 07/05/22 0900  BP: (!) 126/91 (!) 126/91  Pulse: 80 76  Resp: 17 18  Temp: 36.6 C   SpO2: 98% 99%    Last Pain:  Vitals:   07/05/22 0858  TempSrc: Oral  PainSc: 0-No pain                 Louann Sjogren

## 2022-07-08 NOTE — Addendum Note (Signed)
Addendum  created 07/08/22 1343 by Myna Bright, CRNA   Intraprocedure Staff edited

## 2022-07-11 ENCOUNTER — Encounter (HOSPITAL_COMMUNITY): Payer: Self-pay | Admitting: Obstetrics & Gynecology

## 2022-07-12 ENCOUNTER — Encounter: Payer: Self-pay | Admitting: Obstetrics & Gynecology

## 2022-07-13 ENCOUNTER — Ambulatory Visit (INDEPENDENT_AMBULATORY_CARE_PROVIDER_SITE_OTHER): Payer: Self-pay | Admitting: Obstetrics & Gynecology

## 2022-07-13 ENCOUNTER — Encounter: Payer: Self-pay | Admitting: Obstetrics & Gynecology

## 2022-07-13 VITALS — BP 150/102 | HR 98 | Ht 63.0 in | Wt 249.4 lb

## 2022-07-13 DIAGNOSIS — Z9889 Other specified postprocedural states: Secondary | ICD-10-CM

## 2022-07-13 NOTE — Progress Notes (Signed)
    PostOp Visit Note  Christine Woodard is a 30 y.o. [id number] female who presents for a postoperative visit. She is 1 week postop following a cervical ablation completed on 10/24   Today she notes that she has had some discharge and odor.  Denies heavy vaginal bleeding.  Denies fever or chills.  Tolerating gen diet.  Some mild cramping- taking tylenol.  Also noting both diarrhea and constipation.  Review of Systems Pertinent items are noted in HPI.    Objective:  BP (!) 145/98 (BP Location: Right Arm, Patient Position: Sitting, Cuff Size: Normal)   Pulse 90   Ht 5\' 3"  (1.6 m)   Wt 249 lb 6.4 oz (113.1 kg)   LMP  (LMP Unknown) Comment: Urine pregnancy negative on 07-01-2022  BMI 44.18 kg/m    Physical Examination:  GENERAL ASSESSMENT: well developed and well nourished SKIN: normal color, no lesions CHEST: normal air exchange, respiratory effort normal with no retractions HEART: regular rate and rhythm ABDOMEN: soft, non-distended GU:  Vulva:  normal appearing vulva with no masses, tenderness or lesions Vagina:  normal mucosa Cervix:   healing appropriately , minimal thin yellow discharge noted EXTREMITY: no edema PSYCH: mood appropriate, normal affect       Assessment:    S/p cervical ablation   Plan:   -continue pelvic rest -healing appropriately -f/u in 33mos for repeat testing/Pap  Janyth Pupa, DO Attending [place], [place] for [employer], [place]

## 2022-09-08 DIAGNOSIS — Z6841 Body Mass Index (BMI) 40.0 and over, adult: Secondary | ICD-10-CM | POA: Diagnosis not present

## 2022-09-08 DIAGNOSIS — R6883 Chills (without fever): Secondary | ICD-10-CM | POA: Diagnosis not present

## 2022-09-08 DIAGNOSIS — R300 Dysuria: Secondary | ICD-10-CM | POA: Diagnosis not present

## 2022-09-08 DIAGNOSIS — R030 Elevated blood-pressure reading, without diagnosis of hypertension: Secondary | ICD-10-CM | POA: Diagnosis not present

## 2022-09-23 ENCOUNTER — Telehealth: Payer: Self-pay

## 2022-09-23 NOTE — Telephone Encounter (Signed)
Mychat msg sent. AS, CMA

## 2022-09-27 DIAGNOSIS — R300 Dysuria: Secondary | ICD-10-CM | POA: Diagnosis not present

## 2022-10-10 ENCOUNTER — Other Ambulatory Visit (HOSPITAL_COMMUNITY): Payer: Self-pay | Admitting: Family Medicine

## 2022-10-10 DIAGNOSIS — R109 Unspecified abdominal pain: Secondary | ICD-10-CM | POA: Diagnosis not present

## 2022-10-10 DIAGNOSIS — L659 Nonscarring hair loss, unspecified: Secondary | ICD-10-CM | POA: Diagnosis not present

## 2022-10-10 DIAGNOSIS — E559 Vitamin D deficiency, unspecified: Secondary | ICD-10-CM | POA: Diagnosis not present

## 2022-10-10 DIAGNOSIS — E538 Deficiency of other specified B group vitamins: Secondary | ICD-10-CM | POA: Diagnosis not present

## 2022-10-10 DIAGNOSIS — Z6841 Body Mass Index (BMI) 40.0 and over, adult: Secondary | ICD-10-CM | POA: Diagnosis not present

## 2022-10-10 DIAGNOSIS — R030 Elevated blood-pressure reading, without diagnosis of hypertension: Secondary | ICD-10-CM | POA: Diagnosis not present

## 2022-10-20 ENCOUNTER — Ambulatory Visit (HOSPITAL_COMMUNITY)
Admission: RE | Admit: 2022-10-20 | Discharge: 2022-10-20 | Disposition: A | Discharge status: Home / Self Care | Payer: Medicaid Other | Source: Ambulatory Visit | Attending: Family Medicine | Admitting: Family Medicine

## 2022-10-20 DIAGNOSIS — R109 Unspecified abdominal pain: Secondary | ICD-10-CM | POA: Diagnosis present

## 2022-10-20 DIAGNOSIS — K760 Fatty (change of) liver, not elsewhere classified: Secondary | ICD-10-CM | POA: Diagnosis not present

## 2022-10-20 DIAGNOSIS — N858 Other specified noninflammatory disorders of uterus: Secondary | ICD-10-CM | POA: Diagnosis not present

## 2022-10-20 DIAGNOSIS — R1032 Left lower quadrant pain: Secondary | ICD-10-CM | POA: Diagnosis not present

## 2022-10-21 ENCOUNTER — Ambulatory Visit (HOSPITAL_COMMUNITY): Admission: RE | Admit: 2022-10-21 | Payer: Medicaid Other | Source: Ambulatory Visit

## 2022-10-21 ENCOUNTER — Ambulatory Visit (HOSPITAL_COMMUNITY): Payer: Medicaid Other

## 2022-11-21 ENCOUNTER — Encounter: Payer: Self-pay | Admitting: Obstetrics & Gynecology

## 2022-12-02 ENCOUNTER — Ambulatory Visit (INDEPENDENT_AMBULATORY_CARE_PROVIDER_SITE_OTHER): Payer: Medicaid Other | Admitting: Obstetrics & Gynecology

## 2022-12-02 ENCOUNTER — Encounter: Payer: Self-pay | Admitting: Obstetrics & Gynecology

## 2022-12-02 VITALS — BP 138/89 | HR 77 | Ht 62.0 in | Wt 253.0 lb

## 2022-12-02 DIAGNOSIS — R102 Pelvic and perineal pain: Secondary | ICD-10-CM

## 2022-12-02 DIAGNOSIS — Z975 Presence of (intrauterine) contraceptive device: Secondary | ICD-10-CM

## 2022-12-02 NOTE — Progress Notes (Signed)
   GYN VISIT Patient name: Christine Woodard MRN [id number]  Date of birth: 1991-06-26 Chief Complaint:   lower abdominal pain, pelvic pain (Started 2 months ago, getting worse, spotting, black discharge)  History of Present Illness:   Christine Woodard is a 32 y.o. [phone] female being seen today for pelvic pain  Pelvic pain: Pain is on/off and gradually getting worse.   Pain is super sharp in lower stomach.  She has noted some spotting- not really having a period.   Pain on bad days a 10/10, other times mostly 5-6.  Tried to take tylenol, which didn't seem to help.  Some nausea, no vomiting.  Both notes diarrhea/constipation.  Occasional discomfort after IC.  No other acute complaints.  Reviewed [place] completed 10/20/2022- normal findings.  No acute abnormality, IUD in place.  No LMP recorded. (Menstrual status: IUD).     03/07/2022    1:39 PM 01/21/2020    1:13 PM 11/05/2019    2:11 PM 11/05/2019    2:10 PM  Depression screen PHQ 2/9  Decreased Interest 2 2 1 1   Down, Depressed, Hopeless 1 1 1 1   PHQ - 2 Score 3 3 2 2   Altered sleeping 3 3 3    Tired, decreased energy 2 3 3    Change in appetite 2 3 3    Feeling bad or failure about yourself  1 1 1    Trouble concentrating 0 0 1   Moving slowly or fidgety/restless 1 0 1   Suicidal thoughts 0 0 0   PHQ-9 Score 12 13 14    Difficult doing work/chores  Somewhat difficult       Review of Systems:   Pertinent items are noted in HPI Denies fever/chills, dizziness, headaches, visual disturbances, fatigue, shortness of breath, chest pain. Pertinent History Reviewed:  Reviewed past medical,surgical, social, obstetrical and family history.  Reviewed problem list, medications and allergies. Physical Assessment:   Vitals:   12/02/22 1148  BP: 138/89  Pulse: 77  Weight: 253 lb (114.8 kg)  Height: 5\' 2"  (1.575 m)  Body mass index is 46.27 kg/m.       Physical Examination:   General appearance: alert, well appearing, and in no  distress  Psych: mood appropriate, normal affect  Skin: warm & dry   Cardiovascular: normal heart rate noted  Respiratory: normal respiratory effort, no distress  Abdomen: obese, soft, non-tender- no reproducible pain.  No rebound, no guarding  Pelvic: VULVA: normal appearing vulva with no masses, tenderness or lesions, VAGINA: normal appearing vagina with normal color and discharge, no lesions, CERVIX: normal appearing cervix without discharge or lesions, IUD strings visualized UTERUS: uterus is normal size, shape, consistency and nontender, ADNEXA: normal adnexa in size, nontender and no masses  Extremities: no edema   Chaperone:  pt declined     Assessment & Plan:  1) Pelvic pain -no evidence of acute abdomen -no abnormalities noted by [place] or on exam -etiology of pain unclear, possible GI in nature -advised food diary and f/u with PCP if needed -may also try OTC pain medications   F/U in May as scheduled   Janyth Pupa, DO Attending [place], [place] for [place], [place]

## 2023-01-11 ENCOUNTER — Encounter: Payer: Self-pay | Admitting: Obstetrics & Gynecology

## 2023-01-11 ENCOUNTER — Other Ambulatory Visit (HOSPITAL_COMMUNITY)
Admission: RE | Admit: 2023-01-11 | Discharge: 2023-01-11 | Disposition: A | Discharge status: Home / Self Care | Payer: Medicaid Other | Source: Ambulatory Visit | Attending: Obstetrics & Gynecology | Admitting: Obstetrics & Gynecology

## 2023-01-11 ENCOUNTER — Ambulatory Visit (INDEPENDENT_AMBULATORY_CARE_PROVIDER_SITE_OTHER): Payer: Medicaid Other | Admitting: Obstetrics & Gynecology

## 2023-01-11 VITALS — BP 134/81 | HR 82 | Ht 62.0 in | Wt 252.2 lb

## 2023-01-11 DIAGNOSIS — Z87410 Personal history of cervical dysplasia: Secondary | ICD-10-CM | POA: Diagnosis not present

## 2023-01-11 DIAGNOSIS — Z9889 Other specified postprocedural states: Secondary | ICD-10-CM

## 2023-01-11 NOTE — Progress Notes (Signed)
   GYN VISIT Patient name: Christine Woodard MRN [id number]  Date of birth: [birth year] Chief Complaint:   Gynecologic Exam (6 month pap)  History of Present Illness:   Christine Woodard is a 32 y.o. [phone]  female being seen today for cervical dysplasia follow-up.     -Status post laser ablation 06/2022  She reports no acute complaints.  Denies vaginal bleeding spotting.  Denies vaginal discharge itching or irritation.  Denies pelvic or abdominal pain.  No LMP recorded. (Menstrual status: IUD).     03/07/2022    1:39 PM 01/21/2020    1:13 PM 11/05/2019    2:11 PM 11/05/2019    2:10 PM  Depression screen PHQ 2/9  Decreased Interest 2 2 1 1   Down, Depressed, Hopeless 1 1 1 1   PHQ - 2 Score 3 3 2 2   Altered sleeping 3 3 3    Tired, decreased energy 2 3 3    Change in appetite 2 3 3    Feeling bad or failure about yourself  1 1 1    Trouble concentrating 0 0 1   Moving slowly or fidgety/restless 1 0 1   Suicidal thoughts 0 0 0   PHQ-9 Score 12 13 14    Difficult doing work/chores  Somewhat difficult       Review of Systems:   Pertinent items are noted in HPI Denies fever/chills, dizziness, headaches, visual disturbances, fatigue, shortness of breath, chest pain, abdominal pain, vomiting, no problems with periods, bowel movements, urination, or intercourse unless otherwise stated above.  Pertinent History Reviewed:  Reviewed past medical,surgical, social, obstetrical and family history.  Reviewed problem list, medications and allergies. Physical Assessment:   Vitals:   01/11/23 0834  BP: 134/81  Pulse: 82  Weight: 252 lb 3.2 oz (114.4 kg)  Height: 5\' 2"  (1.575 m)  Body mass index is 46.13 kg/m.       Physical Examination:   General appearance: alert, well appearing, and in no distress  Psych: mood appropriate, normal affect  Skin: warm & dry   Cardiovascular: normal heart rate noted  Respiratory: normal respiratory effort, no distress  Abdomen: soft, non-tender   Pelvic:  VULVA: normal appearing vulva with no masses, tenderness or lesions, VAGINA: normal appearing vagina with normal color and discharge, no lesions, CERVIX: normal appearing cervix without discharge or lesions.  Strings not well-seen  Extremities: no edema   Chaperone:  Patient declined     Assessment & Plan:  1) history of laser ablation, cervical dysplasia -Repeat Pap collected today, if negative will plan for Pap in 1 year annually for 3 years  IUD in place   Return for Nov 2024 annual.   Myna Hidalgo, DO Attending Obstetrician & Gynecologist, [facility] for [facility], [facility]

## 2023-01-16 LAB — CYTOLOGY - PAP
Adequacy: ABSENT
Comment: NEGATIVE
Diagnosis: UNDETERMINED — AB
High risk HPV: NEGATIVE

## 2023-02-02 DIAGNOSIS — R319 Hematuria, unspecified: Secondary | ICD-10-CM | POA: Diagnosis not present

## 2023-02-02 DIAGNOSIS — R030 Elevated blood-pressure reading, without diagnosis of hypertension: Secondary | ICD-10-CM | POA: Diagnosis not present

## 2023-02-02 DIAGNOSIS — R300 Dysuria: Secondary | ICD-10-CM | POA: Diagnosis not present

## 2023-02-02 DIAGNOSIS — R350 Frequency of micturition: Secondary | ICD-10-CM | POA: Diagnosis not present

## 2023-05-01 DIAGNOSIS — R030 Elevated blood-pressure reading, without diagnosis of hypertension: Secondary | ICD-10-CM | POA: Diagnosis not present

## 2023-05-01 DIAGNOSIS — Z6841 Body Mass Index (BMI) 40.0 and over, adult: Secondary | ICD-10-CM | POA: Diagnosis not present

## 2023-05-01 DIAGNOSIS — H6990 Unspecified Eustachian tube disorder, unspecified ear: Secondary | ICD-10-CM | POA: Diagnosis not present

## 2023-05-01 DIAGNOSIS — N644 Mastodynia: Secondary | ICD-10-CM | POA: Diagnosis not present

## 2023-05-02 ENCOUNTER — Other Ambulatory Visit (HOSPITAL_COMMUNITY): Payer: Self-pay | Admitting: Family Medicine

## 2023-05-02 DIAGNOSIS — N644 Mastodynia: Secondary | ICD-10-CM

## 2023-05-23 ENCOUNTER — Ambulatory Visit (HOSPITAL_COMMUNITY)
Admission: RE | Admit: 2023-05-23 | Discharge: 2023-05-23 | Disposition: A | Discharge status: Home / Self Care | Payer: Medicaid Other | Source: Ambulatory Visit | Attending: Family Medicine | Admitting: Family Medicine

## 2023-05-23 DIAGNOSIS — N644 Mastodynia: Secondary | ICD-10-CM | POA: Diagnosis present

## 2023-07-09 DIAGNOSIS — H5213 Myopia, bilateral: Secondary | ICD-10-CM | POA: Diagnosis not present

## 2023-07-27 ENCOUNTER — Ambulatory Visit: Payer: Medicaid Other

## 2023-07-27 ENCOUNTER — Ambulatory Visit
Admission: EM | Admit: 2023-07-27 | Discharge: 2023-07-27 | Disposition: A | Discharge status: Home / Self Care | Payer: Medicaid Other | Attending: Nurse Practitioner | Admitting: Nurse Practitioner

## 2023-07-27 DIAGNOSIS — M25572 Pain in left ankle and joints of left foot: Secondary | ICD-10-CM

## 2023-07-27 DIAGNOSIS — S99912A Unspecified injury of left ankle, initial encounter: Secondary | ICD-10-CM | POA: Diagnosis not present

## 2023-07-27 DIAGNOSIS — M7732 Calcaneal spur, left foot: Secondary | ICD-10-CM | POA: Diagnosis not present

## 2023-07-27 NOTE — Discharge Instructions (Addendum)
The x-ray of your left ankle is pending.  You will be contacted if the results of the x-ray are abnormal. Continue over-the-counter ibuprofen or Tylenol as needed for pain or discomfort. RICE therapy, rest, ice, compression, and elevation.  Apply ice for 20 minutes, remove for 1 hour, repeat as needed. Gentle range of motion exercises to help improve joint mobility.  I provided exercises for you to perform at least 2-3 times daily. A cam walker boot has been provided to help with your mobility.  As discussed, you should not become dependent on wearing the boot, you should begin to walk and bear weight daily to eventually return to your normal baseline functioning. If symptoms have not improved over the next 2 to 3 weeks, I would like for you to follow-up with orthopedics.  Follow-up as needed.

## 2023-07-27 NOTE — ED Triage Notes (Signed)
Pt reports she fell in a hole x 1 day injuring her left ankle. States she twisted it she heard it pop.

## 2023-07-27 NOTE — ED Provider Notes (Signed)
[facility]    CSN: [id number] Arrival date & time: 07/27/23  0804      History   Chief Complaint No chief complaint on file.   HPI Christine Woodard is a 32 y.o. female.   The history is provided by the patient.   Patient presents for complaints of left ankle pain that started after she stepped into a hole and rolled her right ankle 1 day ago.  Patient states she felt a "pop" in the ankle.  Since that time, she has swelling to the outside of her ankle along with pain that radiates from time to time down to the foot.  Patient denies numbness, tingling, or inability to bear weight.  Patient reports that she did take ibuprofen and applied ice, thinking her symptoms would improve.  Patient denies prior injury or trauma.  Past Medical History:  Diagnosis Date   Back pain    Eclampsia    Headache(784.0)    Seizures (HCC)    during pregnancy   Vaginal Pap smear, abnormal     Patient Active Problem List   Diagnosis Date Noted   Dysplasia of cervix, high grade CIN 2    Abnormal Pap smear of cervix 05/09/2022   White matter abnormality on MRI of brain 05/13/2020   Dysesthesia 05/13/2020   Other fatigue 05/13/2020   Vision disturbance 05/13/2020   Insomnia 05/13/2020   Encounter for IUD insertion 01/22/2020   Hemorrhoids 11/05/2019   Abnormal uterine bleeding (AUB) 11/05/2019   Weight gain 11/05/2019   Elevated BP without diagnosis of hypertension 11/05/2019    Past Surgical History:  Procedure Laterality Date   CERVICAL ABLATION N/A 07/05/2022   Procedure: CERVICAL ABLATION;  Surgeon: Myna Hidalgo, DO;  Location: [facility];  Service: Gynecology;  Laterality: N/A;    OB History     Gravida  2   Para  2   Term  1   Preterm  1   AB  0   Living  2      SAB  0   IAB  0   Ectopic  0   Multiple  0   Live Births  2            Home Medications    Prior to Admission medications   Medication Sig Start Date End Date Taking? Authorizing  Provider  acetaminophen (TYLENOL) 500 MG tablet Take 500-1,000 mg by mouth every 6 (six) hours as needed (pain.). Patient not taking: Reported on 12/02/2022    [provider]  aspirin-acetaminophen-caffeine (EXCEDRIN MIGRAINE) [phone] MG tablet Take 1-2 tablets by mouth 2 (two) times daily as needed for headache.    [provider]  ferrous sulfate 325 (65 FE) MG tablet Take 325 mg by mouth every evening.    [provider]  levonorgestrel (LILETTA, 52 MG,) 20.1 MCG/DAY IUD IUD 1 each by Intrauterine route once.    [provider]    Family History Family History  Problem Relation Age of Onset   Diabetes Father    Heart disease Father    Diabetes Paternal Grandmother    Hypertension Paternal Grandmother    Diabetes Paternal Grandfather    Breast cancer Maternal Grandmother    Cancer Maternal Grandmother    Thyroid disease Maternal Grandmother    Heart disease Maternal Grandfather    Hypotension Mother    Anemia Mother    Irritable bowel syndrome Mother    Seizures Sister    Hypertension Sister  Thyroid disease Sister    Hypertension Sister    Diabetes Sister        prediabetic   Anemia Sister     Social History Social History   Tobacco Use   Smoking status: Never   Smokeless tobacco: Never  Vaping Use   Vaping status: Never Used  Substance Use Topics   Alcohol use: Yes    Comment: seldom   Drug use: No     Allergies   Amoxicillin and Latex   Review of Systems Review of Systems Per HPI  Physical Exam Triage Vital Signs ED Triage Vitals [07/27/23 0821]  Encounter Vitals Group     BP (!) 129/92     Systolic BP Percentile      Diastolic BP Percentile      Pulse Rate 90     Resp 18     Temp 98.5 F (36.9 C)     Temp Source Oral     SpO2 97 %     Weight      Height      Head Circumference      Peak Flow      Pain Score 7     Pain Loc      Pain Education      Exclude from Growth Chart    No data  found.  Updated Vital Signs BP (!) 129/92 (BP Location: Right Arm)   Pulse 90   Temp 98.5 F (36.9 C) (Oral)   Resp 18   LMP  (LMP Unknown)   SpO2 97%   Visual Acuity Right Eye Distance:   Left Eye Distance:   Bilateral Distance:    Right Eye Near:   Left Eye Near:    Bilateral Near:     Physical Exam Vitals and nursing note reviewed.  Constitutional:      General: She is not in acute distress.    Appearance: Normal appearance.  Eyes:     Extraocular Movements: Extraocular movements intact.     Pupils: Pupils are equal, round, and reactive to light.  Musculoskeletal:     Left lower leg: Normal.     Left ankle: Swelling present. No deformity or ecchymosis. Tenderness present over the lateral malleolus. Decreased range of motion. Normal pulse.     Left foot: Normal.  Skin:    General: Skin is warm and dry.  Neurological:     General: No focal deficit present.     Mental Status: She is alert and oriented to person, place, and time.  Psychiatric:        Mood and Affect: Mood normal.        Behavior: Behavior normal.      UC Treatments / Results  Labs (all labs ordered are listed, but only abnormal results are displayed) Labs Reviewed - No data to display  EKG   Radiology No results found.  Procedures Procedures (including critical care time)  Medications Ordered in UC Medications - No data to display  Initial Impression / Assessment and Plan / UC Course  I have reviewed the triage vital signs and the nursing notes.  Pertinent labs & imaging results that were available during my care of the patient were reviewed by me and considered in my medical decision making (see chart for details).  X-ray of the left ankle is pending.  Differential diagnoses include fracture versus sprain.  Cam boot was provided to help patient with mobility and to provide stabilization and support if the ankle is  fractured.  Supportive care recommendations were provided and discussed  with the patient to include RICE therapy, and over-the-counter analgesics.  Patient was advised to follow-up with orthopedics if symptoms do not improve over the next 2 to 3 weeks.  Patient is in agreement with this plan of care and verbalizes understanding.  All questions were answered.  Patient stable for discharge.  Final Clinical Impressions(s) / UC Diagnoses   Final diagnoses:  Acute left ankle pain  Injury of left ankle, initial encounter     Discharge Instructions      The x-ray of your left ankle is pending.  You will be contacted if the results of the x-ray are abnormal. Continue over-the-counter ibuprofen or Tylenol as needed for pain or discomfort. RICE therapy, rest, ice, compression, and elevation.  Apply ice for 20 minutes, remove for 1 hour, repeat as needed. Gentle range of motion exercises to help improve joint mobility.  I provided exercises for you to perform at least 2-3 times daily. A cam walker boot has been provided to help with your mobility.  As discussed, you should not become dependent on wearing the boot, you should begin to walk and bear weight daily to eventually return to your normal baseline functioning. If symptoms have not improved over the next 2 to 3 weeks, I would like for you to follow-up with orthopedics.  Follow-up as needed.      ED Prescriptions   None    PDMP not reviewed this encounter.   Abran Cantor, NP 07/27/23 [phone]

## 2023-08-24 NOTE — Progress Notes (Unsigned)
   Cardiology Office Note    Date:  08/25/2023  ID:  Christine Woodard, DOB [birth year], MRN [id number] PCP:  Juliette Alcide, MD  Cardiologist:  Orbie Pyo, MD  Electrophysiologist:  None   Chief Complaint: ***  History of Present Illness: .    Christine Woodard is a 32 y.o. female with visit-pertinent history of seizures, eclampsia, obesity seen for follow-up. She saw Dr. Lynnette Caffey 05/2023 for preop eval and atypical chest pain. He felt symptoms were reassuring and did not feel any further cardiac workup was needed. He referred to pharmacy to consider GLP1 but do not see occurred.  No lipids  Atypical chest pain Obesity  Labwork independently reviewed: 06/2022 CBC wnl 06/2021 K 3.8, Cr 0.63 2021 TSH wnl, LFTs wnl  ROS: .    Please see the history of present illness. Otherwise, review of systems is positive for ***.  All other systems are reviewed and otherwise negative.  Studies Reviewed: Marland Kitchen    EKG:  EKG is ordered today, personally reviewed, demonstrating ***  CV Studies: Cardiac studies reviewed are outlined and summarized above. Otherwise please see EMR for full report.   Current Reported Medications:.    No outpatient medications have been marked as taking for the 08/25/23 encounter (Appointment) with Laurann Montana, PA-C.    Physical Exam:    VS:  LMP  (LMP Unknown)    Wt Readings from Last 3 Encounters:  01/11/23 252 lb 3.2 oz (114.4 kg)  12/02/22 253 lb (114.8 kg)  07/13/22 249 lb 6.4 oz (113.1 kg)    GEN: Well nourished, well developed in no acute distress NECK: No JVD; No carotid bruits CARDIAC: ***RRR, no murmurs, rubs, gallops RESPIRATORY:  Clear to auscultation without rales, wheezing or rhonchi  ABDOMEN: Soft, non-tender, non-distended EXTREMITIES:  No edema; No acute deformity   Asessement and Plan:.     ***     Disposition: F/u with ***  Signed, Laurann Montana, PA-C

## 2023-08-25 ENCOUNTER — Ambulatory Visit: Payer: Medicaid Other | Admitting: Physician Assistant

## 2023-08-25 DIAGNOSIS — R0789 Other chest pain: Secondary | ICD-10-CM

## 2023-09-27 ENCOUNTER — Encounter: Payer: Self-pay | Admitting: Adult Health

## 2023-09-27 ENCOUNTER — Ambulatory Visit (INDEPENDENT_AMBULATORY_CARE_PROVIDER_SITE_OTHER): Payer: Medicaid Other | Admitting: Adult Health

## 2023-09-27 VITALS — BP 130/100 | HR 85 | Ht 63.0 in | Wt 245.5 lb

## 2023-09-27 DIAGNOSIS — N920 Excessive and frequent menstruation with regular cycle: Secondary | ICD-10-CM

## 2023-09-27 DIAGNOSIS — I10 Essential (primary) hypertension: Secondary | ICD-10-CM | POA: Diagnosis not present

## 2023-09-27 DIAGNOSIS — N898 Other specified noninflammatory disorders of vagina: Secondary | ICD-10-CM | POA: Diagnosis not present

## 2023-09-27 DIAGNOSIS — R3915 Urgency of urination: Secondary | ICD-10-CM | POA: Diagnosis not present

## 2023-09-27 DIAGNOSIS — R102 Pelvic and perineal pain: Secondary | ICD-10-CM | POA: Diagnosis not present

## 2023-09-27 DIAGNOSIS — Z975 Presence of (intrauterine) contraceptive device: Secondary | ICD-10-CM | POA: Diagnosis not present

## 2023-09-27 LAB — POCT WET PREP (WET MOUNT)
Clue Cells Wet Prep Whiff POC: NEGATIVE
WBC, Wet Prep HPF POC: POSITIVE

## 2023-09-27 LAB — POCT URINALYSIS DIPSTICK
Blood, UA: NEGATIVE
Glucose, UA: NEGATIVE
Ketones, UA: NEGATIVE
Leukocytes, UA: NEGATIVE
Nitrite, UA: NEGATIVE
Protein, UA: NEGATIVE

## 2023-09-27 MED ORDER — METRONIDAZOLE 0.75 % VA GEL: 1.0000 | Freq: Every day | VAGINAL | 0 refills | Status: DC

## 2023-09-27 MED ORDER — HYDROCHLOROTHIAZIDE 12.5 MG PO CAPS: 12.5000 mg | ORAL_CAPSULE | Freq: Every day | ORAL | 3 refills | Status: DC

## 2023-09-27 NOTE — Progress Notes (Signed)
 Subjective:     Patient ID: Christine Woodard, female   DOB: 14-Feb-1991, 33 y.o.   MRN: [id number]  HPI Christine Woodard is a 33 year old white female, married, G2P1102 in complaining of spotting on and off for about 2 weeks, and cramping. Has urge to pee and has to push out.     Component Value Date/Time   DIAGPAP (A) 01/11/2023 0846    - Atypical squamous cells of undetermined significance (ASC-US )   DIAGPAP - Low grade squamous intraepithelial lesion (LSIL) (A) 03/07/2022 1329   DIAGPAP  11/05/2019 1413    - Negative for intraepithelial lesion or malignancy (NILM)   HPVHIGH Negative 01/11/2023 0846   HPVHIGH Positive (A) 03/07/2022 1329   HPVHIGH Positive (A) 11/05/2019 1413   ADEQPAP  01/11/2023 0846    Satisfactory for evaluation; transformation zone component ABSENT.   ADEQPAP  03/07/2022 1329    Satisfactory for evaluation; transformation zone component PRESENT.   ADEQPAP  11/05/2019 1413    Satisfactory for evaluation; transformation zone component PRESENT.    PCP is Dr Herman Longs  Review of Systems  +spotting on and off for about 2 weeks,  +cramping Has urge to pee and has to push out. Reviewed past medical,surgical, social and family history. Reviewed medications and allergies.     Objective:   Physical Exam BP (!) 130/100 (BP Location: Left Arm, Patient Position: Sitting, Cuff Size: Large)   Pulse 85   Ht 5\' 3"  (1.6 m)   Wt 245 lb 8 oz (111.4 kg)   BMI 43.49 kg/m  Urine dipstick was negative  Skin warm and dry. Lungs: clear to ausculation bilaterally. Cardiovascular: regular rate and rhythm.   .Pelvic: external genitalia is normal in appearance no lesions, vagina: mucous discharge without odor,urethra has no lesions or masses noted, cervix: smooth, + IUD strings at os, uterus: normal size, shape and contour, non tender, no masses felt, adnexa: no masses or tenderness noted. Bladder is non tender and no masses felt. Wet prep: + for clue cells and +WBCs.  Upstream - 09/27/23  1106       Pregnancy Intention Screening   Does the patient want to become pregnant in the next year? No    Does the patient's partner want to become pregnant in the next year? No    Would the patient like to discuss contraceptive options today? No      Contraception Wrap Up   Current Method IUD or IUS    End Method IUD or IUS    Contraception Counseling Provided Yes            Examination chaperoned by Alphonso Aschoff LPN  Assessment:     1. Urinary urgency - POCT Urinalysis Dipstick  2. Hypertension, unspecified type Will rx Microzide  12.5 mg 1 daily Watch salt and sugar Meds ordered this encounter  Medications   metroNIDAZOLE  (METROGEL ) 0.75 % vaginal gel    Sig: Place 1 Applicatorful vaginally at bedtime.    Dispense:  70 g    Refill:  0    Supervising Provider:   Evalyn Hillier H [[id number]]   hydrochlorothiazide  (MICROZIDE ) 12.5 MG capsule    Sig: Take 1 capsule (12.5 mg total) by mouth daily.    Dispense:  30 capsule    Refill:  3    Supervising Provider:   Evalyn Hillier H [[id number]]     3. Pelvic cramping (Primary)  4. IUD (intrauterine device) in place Liletta  placed 01/22/20  5. Spotting between menses Spotting on and  off for about 2 weeks   6. Vaginal discharge +WBC and clue cells Will rx metrogel  for 5 nights in vagina  - POCT Wet Prep [facility])     Plan:     Follow up in 4 weeks for ROS and BP check

## 2023-10-08 NOTE — Progress Notes (Unsigned)
  Cardiology Office Note:  .   Date:  10/11/2023  ID:  [name], DOB [birth year], MRN [id number] PCP: Juliette Alcide, MD  [facility] Providers Cardiologist:  Orbie Pyo, MD    History of Present Illness: .   Christine Woodard is a 33 y.o. female with history of morbid obesity and family history of CAD. Seen once for preop clearance for atypical chest pain 2023 and no tests ordered.  Patient here for f/u. Recently diagnosed with HTN and started on hydrochlorothiazide 12.5 mg daily. She's been dizzy since but also has history of anemia. She complains of her heart racing at rest,lasts ~30 min off and all most of the day. Has occasional sharp pains in her left chest and shoulder at random times and has been going on forever. Walks 1 mile several times a week, sometimes 1/2 mile on treadmill. Doesn't feel heart racing as much with exercise. GF CABG twice another GF heart disease, Father stents age 56's. Trying to lose weight, has lost ~ 50 lbs in the past year but would like to speak with a nutritionist.  ROS:    Studies Reviewed: Marland Kitchen    EKG Interpretation Date/Time:  Wednesday October 11 2023 09:27:08 EST Ventricular Rate:  97 PR Interval:  150 QRS Duration:  84 QT Interval:  358 QTC Calculation: 454 R Axis:   -16  Text Interpretation: Normal sinus rhythm Normal ECG When compared with ECG of 20-May-2022 09:40, No significant change was found Confirmed by Jacolyn Reedy [phone]) on 10/11/2023 9:29:36 AM    Prior CV Studies:      Risk Assessment/Calculations:             Physical Exam:   VS:  BP 125/88   Pulse 97   Ht 5\' 3"  (1.6 m)   Wt 242 lb (109.8 kg)   SpO2 98%   BMI 42.87 kg/m    Wt Readings from Last 3 Encounters:  10/11/23 242 lb (109.8 kg)  09/27/23 245 lb 8 oz (111.4 kg)  01/11/23 252 lb 3.2 oz (114.4 kg)    GEN: Obese in no acute distress NECK: No JVD; No carotid bruits CARDIAC:  RRR, no murmurs, rubs, gallops RESPIRATORY:  Clear to  auscultation without rales, wheezing or rhonchi  ABDOMEN: Soft, non-tender, non-distended EXTREMITIES:  No edema; No deformity   ASSESSMENT AND PLAN: .    HTN-new diagnosis and just started on hydrochlorothiazide, controlled  Palpitations-check labs today including CMET, CBC, FLP and TSH  Chest pain atypical and fleeting. Family history of CAD-recommend coronary calcium score but she wants to talk to her husband first  Family history of CAD  Morbid obesity-has lost ~ 50 lbs in the past year and requesting to speak with nutritionist. Will refer. 150 min exercise.        Dispo: f/u in 1 yr.  Signed, Jacolyn Reedy, PA-C

## 2023-10-11 ENCOUNTER — Ambulatory Visit (INDEPENDENT_AMBULATORY_CARE_PROVIDER_SITE_OTHER): Payer: Medicaid Other

## 2023-10-11 ENCOUNTER — Encounter: Payer: Self-pay | Admitting: Physician Assistant

## 2023-10-11 ENCOUNTER — Ambulatory Visit: Payer: Medicaid Other | Attending: Physician Assistant | Admitting: Physician Assistant

## 2023-10-11 ENCOUNTER — Other Ambulatory Visit: Payer: Self-pay | Admitting: Physician Assistant

## 2023-10-11 VITALS — BP 125/88 | HR 97 | Ht 63.0 in | Wt 242.0 lb

## 2023-10-11 DIAGNOSIS — Z8249 Family history of ischemic heart disease and other diseases of the circulatory system: Secondary | ICD-10-CM

## 2023-10-11 DIAGNOSIS — Z6841 Body Mass Index (BMI) 40.0 and over, adult: Secondary | ICD-10-CM | POA: Diagnosis not present

## 2023-10-11 DIAGNOSIS — R079 Chest pain, unspecified: Secondary | ICD-10-CM

## 2023-10-11 DIAGNOSIS — R002 Palpitations: Secondary | ICD-10-CM

## 2023-10-11 DIAGNOSIS — I10 Essential (primary) hypertension: Secondary | ICD-10-CM | POA: Diagnosis not present

## 2023-10-11 LAB — CBC

## 2023-10-11 NOTE — Progress Notes (Unsigned)
Enrolled for [employer] to mail a ZIO XT long term holter monitor to the patients address on file.   Dr. Lynnette Caffey to read.

## 2023-10-11 NOTE — Patient Instructions (Signed)
Medication Instructions:  Your physician recommends that you continue on your current medications as directed. Please refer to the Current Medication list given to you today.  *If you need a refill on your cardiac medications before your next appointment, please call your pharmacy*   Lab Work: TODAY:  LIPID, CMET, CBC, & TSH  If you have labs (blood work) drawn today and your tests are completely normal, you will receive your results only by: MyChart Message (if you have MyChart) OR A paper copy in the mail If you have any lab test that is abnormal or we need to change your treatment, we will call you to review the results.   Testing/Procedures: Christine Woodard- Long Term Monitor Instructions  Your physician has requested you wear a ZIO patch monitor for 14 days.  This is a single patch monitor. [employer] supplies one patch monitor per enrollment. Additional stickers are not available. Please do not apply patch if you will be having a Nuclear Stress Test,  Echocardiogram, Cardiac CT, MRI, or Chest Xray during the period you would be wearing the  monitor. The patch cannot be worn during these tests. You cannot remove and re-apply the  ZIO XT patch monitor.  Your ZIO patch monitor will be mailed 3 day USPS to your address on file. It may take 3-5 days  to receive your monitor after you have been enrolled.  Once you have received your monitor, please review the enclosed instructions. Your monitor  has already been registered assigning a specific monitor serial # to you.  Billing and Patient Assistance Program Information  We have supplied [employer] with any of your insurance information on file for billing purposes. [employer] offers a sliding scale Patient Assistance Program for patients that do not have  insurance, or whose insurance does not completely cover the cost of the ZIO monitor.  You must apply for the Patient Assistance Program to qualify for this discounted rate.  To apply, please call  [employer] at [phone], select option 4, select option 2, ask to apply for  Patient Assistance Program. Meredeth Ide will ask your household income, and how many people  are in your household. They will quote your out-of-pocket cost based on that information.  [employer] will also be able to set up a 84-month, interest-free payment plan if needed.  Applying the monitor   Shave hair from upper left chest.  Hold abrader disc by orange tab. Rub abrader in 40 strokes over the upper left chest as  indicated in your monitor instructions.  Clean area with 4 enclosed alcohol pads. Let dry.  Apply patch as indicated in monitor instructions. Patch will be placed under collarbone on left  side of chest with arrow pointing upward.  Rub patch adhesive wings for 2 minutes. Remove white label marked "1". Remove the white  label marked "2". Rub patch adhesive wings for 2 additional minutes.  While looking in a mirror, press and release button in center of patch. A small green light will  flash 3-4 times. This will be your only indicator that the monitor has been turned on.  Do not shower for the first 24 hours. You may shower after the first 24 hours.  Press the button if you feel a symptom. You will hear a small click. Record Date, Time and  Symptom in the Patient Logbook.  When you are ready to remove the patch, follow instructions on the last 2 pages of Patient  Logbook. Stick patch monitor onto the last page of  Patient Logbook.  Place Patient Logbook in the blue and white box. Use locking tab on box and tape box closed  securely. The blue and white box has prepaid postage on it. Please place it in the mailbox as  soon as possible. Your physician should have your test results approximately 7 days after the  monitor has been mailed back to [facility].  Call [facility] Customer Care at [phone] if you have questions regarding  your ZIO XT patch monitor. Call them immediately if you see an orange  light blinking on your  monitor.  If your monitor falls off in less than 4 days, contact our Monitor department at [phone].  If your monitor becomes loose or falls off after 4 days call [employer] at [phone] for  suggestions on securing your monitor    You have been referred to Nutrition.  They will call you with an appointment   Follow-Up: At [facility], you and your health needs are our priority.  As part of our continuing mission to provide you with exceptional heart care, we have created designated Provider Care Teams.  These Care Teams include your primary Cardiologist (physician) and Advanced Practice Providers (APPs -  Physician Assistants and Nurse Practitioners) who all work together to provide you with the care you need, when you need it.  We recommend signing up for the patient portal called "MyChart".  Sign up information is provided on this After Visit Summary.  MyChart is used to connect with patients for Virtual Visits (Telemedicine).  Patients are able to view lab/test results, encounter notes, upcoming appointments, etc.  Non-urgent messages can be sent to your provider as well.   To learn more about what you can do with MyChart, go to [email or web address].    Your next appointment:   12 month(s)  Provider:   Orbie Pyo, MD     Other Instructions   1st Floor: - Lobby - Registration  - Pharmacy  - Lab - Cafe  2nd Floor: - PV Lab - Diagnostic Testing (echo, CT, nuclear med)  3rd Floor: - Vacant  4th Floor: - TCTS (cardiothoracic surgery) - AFib Clinic - Structural Heart Clinic - Vascular Surgery  - Vascular Ultrasound  5th Floor: - HeartCare Cardiology (general and EP) - Clinical Pharmacy for coumadin, hypertension, lipid, weight-loss medications, and med management appointments    Valet parking services will be available as well.

## 2023-10-12 LAB — CBC
Hematocrit: 44.6 % (ref 34.0–46.6)
Hemoglobin: 15 g/dL (ref 11.1–15.9)
MCH: 31.1 pg (ref 26.6–33.0)
MCHC: 33.6 g/dL (ref 31.5–35.7)
MCV: 93 fL (ref 79–97)
Platelets: 293 10*3/uL (ref 150–450)
RBC: 4.82 x10E6/uL (ref 3.77–5.28)
RDW: 12.1 % (ref 11.7–15.4)
WBC: 11 10*3/uL — ABNORMAL HIGH (ref 3.4–10.8)

## 2023-10-12 LAB — COMPREHENSIVE METABOLIC PANEL
ALT: 39 [IU]/L — ABNORMAL HIGH (ref 0–32)
AST: 23 [IU]/L (ref 0–40)
Albumin: 4.5 g/dL (ref 4.0–5.0)
Alkaline Phosphatase: 59 [IU]/L (ref 44–121)
BUN/Creatinine Ratio: 19 (ref 9–23)
BUN: 13 mg/dL (ref 6–20)
Bilirubin Total: 0.4 mg/dL (ref 0.0–1.2)
CO2: 23 mmol/L (ref 20–29)
Calcium: 9.8 mg/dL (ref 8.7–10.2)
Chloride: 101 mmol/L (ref 96–106)
Creatinine, Ser: 0.67 mg/dL (ref 0.57–1.00)
Globulin, Total: 2.8 g/dL (ref 1.5–4.5)
Glucose: 155 mg/dL — ABNORMAL HIGH (ref 70–99)
Potassium: 4 mmol/L (ref 3.5–5.2)
Sodium: 138 mmol/L (ref 134–144)
Total Protein: 7.3 g/dL (ref 6.0–8.5)
eGFR: 121 mL/min/{1.73_m2} (ref >59)

## 2023-10-12 LAB — LIPID PANEL
Chol/HDL Ratio: 4.5 {ratio} — ABNORMAL HIGH (ref 0.0–4.4)
Cholesterol, Total: 149 mg/dL (ref 100–199)
HDL: 33 mg/dL — ABNORMAL LOW (ref >39)
LDL Chol Calc (NIH): 93 mg/dL (ref 0–99)
Triglycerides: 124 mg/dL (ref 0–149)
VLDL Cholesterol Cal: 23 mg/dL (ref 5–40)

## 2023-10-12 LAB — TSH: TSH: 1.02 u[IU]/mL (ref 0.450–4.500)

## 2023-10-14 ENCOUNTER — Ambulatory Visit
Admission: EM | Admit: 2023-10-14 | Discharge: 2023-10-14 | Disposition: A | Discharge status: Home / Self Care | Payer: Medicaid Other | Attending: Nurse Practitioner | Admitting: Nurse Practitioner

## 2023-10-14 DIAGNOSIS — R002 Palpitations: Secondary | ICD-10-CM

## 2023-10-14 DIAGNOSIS — R079 Chest pain, unspecified: Secondary | ICD-10-CM

## 2023-10-14 DIAGNOSIS — K0889 Other specified disorders of teeth and supporting structures: Secondary | ICD-10-CM | POA: Diagnosis not present

## 2023-10-14 MED ORDER — IBUPROFEN 800 MG PO TABS: 800.0000 mg | ORAL_TABLET | Freq: Three times a day (TID) | ORAL | 0 refills | Status: DC

## 2023-10-14 MED ORDER — KETOROLAC TROMETHAMINE 30 MG/ML IJ SOLN
30.0000 mg | Freq: Once | INTRAMUSCULAR | Status: AC
  Administered 2023-10-14: 30 mg via INTRAMUSCULAR

## 2023-10-14 MED ORDER — CLINDAMYCIN HCL 300 MG PO CAPS: 300.0000 mg | ORAL_CAPSULE | Freq: Two times a day (BID) | ORAL | 0 refills | Status: AC

## 2023-10-14 NOTE — ED Provider Notes (Signed)
[facility]    CSN: [id number] Arrival date & time: 10/14/23  0901      History   Chief Complaint No chief complaint on file.   HPI Christine Woodard is a 33 y.o. female.   The history is provided by the patient.   Patient presents for complaints of right sided facial pain/tooth pain that started over the past 2 weeks.  Patient states pain is also causing her right ear to hurt.  States that she has her wisdom teeth coming in, she is awaiting a consultation with dentistry.  Patient states that the pain has worsened over the past several days.  States that she has been taking Tylenol for her symptoms with minimal relief.  She also endorses mild right-sided facial swelling.  Denies fever, chills, headache, chest pain, abdominal pain, nausea, vomiting, diarrhea, or rash.  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/13/2020   Dysesthesia 05/13/2020   Other fatigue 05/13/2020   Vision disturbance 05/13/2020   Insomnia 05/13/2020   Encounter for IUD insertion 01/22/2020   Hemorrhoids 11/05/2019   Abnormal uterine bleeding (AUB) 11/05/2019   Weight gain 11/05/2019   Elevated BP without diagnosis of hypertension 11/05/2019    Past Surgical History:  Procedure Laterality Date   CERVICAL ABLATION N/A 07/05/2022   Procedure: CERVICAL ABLATION;  Surgeon: Myna Hidalgo, DO;  Location: [facility];  Service: Gynecology;  Laterality: N/A;    OB History     Gravida  2   Para  2   Term  1   Preterm  1   AB  0    Living  2      SAB  0   IAB  0   Ectopic  0   Multiple  0   Live Births  2            Home Medications    Prior to Admission medications   Medication Sig Start Date End Date Taking? Authorizing Provider  acetaminophen (TYLENOL) 500 MG tablet Take 500-1,000 mg by mouth every 6 (six) hours as needed (pain.).    [provider]  aspirin-acetaminophen-caffeine (EXCEDRIN MIGRAINE) [id number] MG tablet Take 1-2 tablets by mouth 2 (two) times daily as needed for headache.    [provider]  ferrous sulfate 325 (65 FE) MG tablet Take 325 mg by mouth every evening.    [provider]  hydrochlorothiazide (MICROZIDE) 12.5 MG capsule Take 1 capsule (12.5 mg total) by mouth daily. 09/27/23   Adline Potter, NP  levonorgestrel (LILETTA, 52 MG,) 20.1 MCG/DAY IUD IUD 1 each by Intrauterine route once.    [provider]  metroNIDAZOLE (METROGEL) 0.75 % vaginal gel Place 1 Applicatorful vaginally at bedtime. 09/27/23   Adline Potter, NP    Family History Family History  Problem Relation Age of Onset   Diabetes Father    Heart disease Father    Diabetes Paternal Grandmother  Hypertension Paternal Grandmother    Diabetes Paternal Grandfather    Breast cancer Maternal Grandmother    Cancer Maternal Grandmother    Thyroid disease Maternal Grandmother    Heart disease Maternal Grandfather    Hypotension Mother    Anemia Mother    Irritable bowel syndrome Mother    Seizures Sister    Hypertension Sister    Thyroid disease Sister    Hypertension Sister    Diabetes Sister        prediabetic   Anemia Sister     Social History Social History   Tobacco Use   Smoking status: Never   Smokeless tobacco: Never  Vaping Use   Vaping status: Never Used  Substance Use Topics   Alcohol use: Yes    Comment: seldom   Drug use: No     Allergies   Amoxicillin and Latex   Review of Systems Review of Systems Per HPI  Physical  Exam Triage Vital Signs ED Triage Vitals  Encounter Vitals Group     BP 10/14/23 0913 (!) 129/92     Systolic BP Percentile --      Diastolic BP Percentile --      Pulse Rate 10/14/23 0913 91     Resp 10/14/23 0913 16     Temp 10/14/23 0913 98.8 F (37.1 C)     Temp Source 10/14/23 0913 Oral     SpO2 10/14/23 0913 97 %     Weight --      Height --      Head Circumference --      Peak Flow --      Pain Score 10/14/23 0922 8     Pain Loc --      Pain Education --      Exclude from Growth Chart --    No data found.  Updated Vital Signs BP (!) 129/92 (BP Location: Right Arm)   Pulse 91   Temp 98.8 F (37.1 C) (Oral)   Resp 16   LMP 09/23/2023 (Approximate)   SpO2 97%   Visual Acuity Right Eye Distance:   Left Eye Distance:   Bilateral Distance:    Right Eye Near:   Left Eye Near:    Bilateral Near:     Physical Exam Vitals and nursing note reviewed.  Constitutional:      General: She is not in acute distress.    Appearance: Normal appearance.  HENT:     Head: Normocephalic.     Right Ear: Tympanic membrane, ear canal and external ear normal.     Left Ear: Tympanic membrane, ear canal and external ear normal.     Nose: Nose normal.     Mouth/Throat:     Lips: Pink.     Mouth: Mucous membranes are moist.     Dentition: Does not have dentures. Dental tenderness present. No gingival swelling or dental abscesses.     Pharynx: No posterior oropharyngeal erythema.   Eyes:     Extraocular Movements: Extraocular movements intact.     Pupils: Pupils are equal, round, and reactive to light.  Cardiovascular:     Rate and Rhythm: Normal rate and regular rhythm.  Pulmonary:     Effort: Pulmonary effort is normal.  Musculoskeletal:     Cervical back: Normal range of motion.  Skin:    General: Skin is warm and dry.  Neurological:     General: No focal deficit present.     Mental Status: She is alert  and oriented to person, place, and time.  Psychiatric:         Mood and Affect: Mood normal.        Behavior: Behavior normal.      UC Treatments / Results  Labs (all labs ordered are listed, but only abnormal results are displayed) Labs Reviewed - No data to display  EKG   Radiology No results found.  Procedures Procedures (including critical care time)  Medications Ordered in UC Medications - No data to display  Initial Impression / Assessment and Plan / UC Course  I have reviewed the triage vital signs and the nursing notes.  Pertinent labs & imaging results that were available during my care of the patient were reviewed by me and considered in my medical decision making (see chart for details).  Patient with dentalgia due to her wisdom tooth coming in.  Toradol 30 mg IM administered for pain in the clinic.  Will start with clindamycin 300 mg 3 times daily for the next 7 days.  For pain, ibuprofen 600 mg prescribed.  Supportive care recommendations were provided and discussed with the patient to include using Tylenol for breakthrough pain or discomfort, warm salt water gargles, and warm compresses.  Patient advised to follow-up with dentistry as scheduled.  Patient is in agreement with this plan of care and verbalizes understanding.  All questions were answered.  Patient stable for discharge.   Final Clinical Impressions(s) / UC Diagnoses   Final diagnoses:  None   Discharge Instructions   None    ED Prescriptions   None    PDMP not reviewed this encounter.   Abran Cantor, NP 10/14/23 [phone]

## 2023-10-14 NOTE — ED Triage Notes (Signed)
Pt reports she has a right side ear ache and right side teeth pain x 2 days

## 2023-10-14 NOTE — Discharge Instructions (Signed)
You were given an injection of Toradol 30 mg today for your pain.  Do not take any additional NSAIDs such as ibuprofen, Aleve, Advil, Motrin, or naproxen.  You may take over-the-counter Tylenol for breakthrough pain or discomfort. Take medication as prescribed. May take over-the-counter Tylenol Extra Strength 1 to 2 hours after ibuprofen for breakthrough pain. Warm salt water gargles 3-4 times daily until symptoms improve. Apply warm compresses to the affected area to help with pain and discomfort. Follow-up with your dentist as scheduled. Follow-up as needed.

## 2023-10-25 ENCOUNTER — Ambulatory Visit: Payer: Medicaid Other | Admitting: Adult Health

## 2023-10-25 ENCOUNTER — Encounter: Payer: Self-pay | Admitting: Adult Health

## 2023-10-25 VITALS — BP 147/97 | HR 87 | Ht 63.0 in | Wt 246.0 lb

## 2023-10-25 DIAGNOSIS — I10 Essential (primary) hypertension: Secondary | ICD-10-CM

## 2023-10-25 MED ORDER — HYDROCHLOROTHIAZIDE 12.5 MG PO CAPS: 12.5000 mg | ORAL_CAPSULE | Freq: Every day | ORAL | 6 refills | Status: DC

## 2023-10-25 NOTE — Progress Notes (Signed)
  Subjective:     Patient ID: Christine Woodard, female   DOB: Apr 02, 1991, 33 y.o.   MRN: [id number]  HPI Christine Woodard is a 33 year old white female, married, G2P1102 back in follow up in BP.  She has had pain with wisdom teeth, needs all 4 removed.  Last pap was negative HPV, ASCUS 01/11/23.   PCP is Dr Leandrew Koyanagi.  Review of Systems Has pain in jaws, needs wisdom teeth removed, has appt Thursday for consult  Reviewed past medical,surgical, social and family history. Reviewed medications and allergies.     Objective:   Physical Exam BP (!) 147/97 (BP Location: Left Wrist, Patient Position: Sitting, Cuff Size: Normal)   Pulse 87   Ht 5\' 3"  (1.6 m)   Wt 246 lb (111.6 kg)   LMP 10/11/2023 (Approximate)   BMI 43.58 kg/m     Skin warm and dry.  Lungs: clear to ausculation bilaterally. Cardiovascular: regular rate and rhythm.   Upstream - 10/25/23 1008       Pregnancy Intention Screening   Does the patient want to become pregnant in the next year? No    Does the patient's partner want to become pregnant in the next year? No    Would the patient like to discuss contraceptive options today? No      Contraception Wrap Up   Current Method IUD or IUS    End Method IUD or IUS    Contraception Counseling Provided Yes             Assessment:     1. Hypertension, unspecified type (Primary) Continue microzide 12.5 mg 1 daily, refill sent  Meds ordered this encounter  Medications   hydrochlorothiazide (MICROZIDE) 12.5 MG capsule    Sig: Take 1 capsule (12.5 mg total) by mouth daily.    Dispense:  30 capsule    Refill:  6    Supervising Provider:   Lazaro Arms [[id number]]       Plan:     Follow up in 3 months for ROS and BP check

## 2023-10-26 ENCOUNTER — Telehealth: Payer: Self-pay

## 2023-10-26 NOTE — Telephone Encounter (Signed)
   Pre-operative Risk Assessment    Patient Name: Christine Woodard  DOB: 1990/11/12 MRN: [id number]   Date of last office visit: 10/11/23 Date of next office visit: n/a  Request for Surgical Clearance    Procedure:  Dental Extraction - Amount of Teeth to be Pulled:  4 teeth  I have called office to request additional information waiting for a call back   Date of Surgery:  Clearance TBD                                 Surgeon:  Georgia Lopes, DMD, PA  Surgeon's Group or Practice Name:   Phone number:  [phone] Fax number:  [phone]   Type of Clearance Requested:   - Medical    Type of Anesthesia:   IV sedation    Additional requests/questions:    Vance Peper   10/26/2023, 4:07 PM

## 2023-10-26 NOTE — Telephone Encounter (Signed)
    Primary Cardiologist: Orbie Pyo, MD  Chart reviewed as part of pre-operative protocol coverage. Simple dental extractions are considered low risk procedures per guidelines and generally do not require any specific cardiac clearance. It is also generally accepted that for simple extractions and dental cleanings, there is no need to interrupt blood thinner therapy.   SBE prophylaxis is not required for the patient.  I will route this recommendation to the requesting party via Epic fax function and remove from pre-op pool.  Please call with questions.  Denyce Robert, NP 10/26/2023, 4:45 PM

## 2023-11-16 ENCOUNTER — Encounter: Payer: Medicaid Other | Attending: Family Medicine | Admitting: Nutrition

## 2023-11-16 VITALS — Ht 63.0 in | Wt 246.0 lb

## 2023-11-16 DIAGNOSIS — I10 Essential (primary) hypertension: Secondary | ICD-10-CM | POA: Diagnosis present

## 2023-11-16 NOTE — Patient Instructions (Addendum)
 Goals Get BP 130/80 or less. Increase vegetables 2 servings with lunch and dinner daily Cut down on using salt and use Mrs. Dash and other herbs and spices to season with Walk every everning for 30 minutes  and add exercise Talk MD sleep study and referral to therapist if desired. Lose 1/2 -1 lb per week.

## 2023-11-16 NOTE — Progress Notes (Signed)
 Medical Nutrition Therapy  Appointment Start time:  0830  Appointment End time:  0930  Primary concerns today: Obesity, chest pain and palpitations. Referral diagnosis: E66.9, Z82.1 R0709, R00.2 Preferred learning style: Hands on and read  Learning readiness: Ready   NUTRITION ASSESSMENT  33 yr old wfemale referred for obesity and HTN from cardiologist. Just started medications hydrochlorothiazide.Does all the cooking and shopping at home. 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 Has lost weight in the past. LIkes to walk for exercise. Has been trying to eat healthier foods but her husband and family still like a lot of processed foods. She notes she has cut back a lot on salt and salty foods in her diet.  BP was elevated at last OBGYN office visit 147/97 mg/dl. HDL 33 low. TCHOL/HDL 4.5  GLUC 155 mg Gained 4 lbs in the last 2 months. Has had history of chest pain and strong family history of CAD. Referral from Cardiology, Jacolyn Reedy PA-C. Starting weight a year ago was 286 lbs.  Wt today was 246 lbs. BMI 43. She notes she has hard time getting proper sleep. No sleep study done. Has a lot of stress in life with family dynamics with extended family. May be interested in therapy. Will talk to PCP. She is willing to engage in Lifestyle Medicine with a whole foods plant predominant diet and the 6 pillars of health.  Clinical Wt Readings from Last 3 Encounters:  11/16/23 246 lb (111.6 kg)  10/25/23 246 lb (111.6 kg)  10/11/23 242 lb (109.8 kg)   Ht Readings from Last 3 Encounters:  11/16/23 5\' 3"  (1.6 m)  10/25/23 5\' 3"  (1.6 m)  10/11/23 5\' 3"  (1.6 m)   Body mass index is 43.58 kg/m. @BMIFA @ Facility age limit for growth %iles is 20 years. Facility age limit for growth %iles is 20 years.  Medical Hx:  Past Medical History:  Diagnosis Date   Back pain    Eclampsia    Headache(784.0)    Hypertension    Seizures (HCC)    during pregnancy   Vaginal Pap smear, abnormal     Medications:   Current Outpatient Medications on File Prior to Visit  Medication Sig Dispense Refill   acetaminophen (TYLENOL) 500 MG tablet Take 500-1,000 mg by mouth every 6 (six) hours as needed (pain.).     hydrochlorothiazide (MICROZIDE) 12.5 MG capsule Take 1 capsule (12.5 mg total) by mouth daily. 30 capsule 6   ibuprofen (ADVIL) 800 MG tablet Take 1 tablet (800 mg total) by mouth 3 (three) times daily. 30 tablet 0   levonorgestrel (LILETTA, 52 MG,) 20.1 MCG/DAY IUD IUD 1 each by Intrauterine route once.     ferrous sulfate 325 (65 FE) MG tablet Take 325 mg by mouth every evening. (Patient not taking: Reported on 11/16/2023)     metroNIDAZOLE (METROGEL) 0.75 % vaginal gel Place 1 Applicatorful vaginally at bedtime. (Patient not taking: Reported on 10/25/2023) 70 g 0   No current facility-administered medications on file prior to visit.    Labs: No results found for: "HGBA1C"    Latest Ref Rng & Units 10/11/2023   10:58 AM 07/09/2021    5:07 PM 11/05/2019    3:10 PM  CMP  Glucose 70 - 99 mg/dL 696  295  84   BUN 6 - 20 mg/dL 13  8  6    Creatinine 0.57 - 1.00 mg/dL 2.84  1.32  4.40   Sodium 134 - 144 mmol/L 138  137  140  Potassium 3.5 - 5.2 mmol/L 4.0  3.8  4.4   Chloride 96 - 106 mmol/L 101  103  102   CO2 20 - 29 mmol/L 23  25  22    Calcium 8.7 - 10.2 mg/dL 9.8  9.0  8.9   Total Protein 6.0 - 8.5 g/dL 7.3   7.2   Total Bilirubin 0.0 - 1.2 mg/dL 0.4   <1.6   Alkaline Phos 44 - 121 IU/L 59   80   AST 0 - 40 IU/L 23   24   ALT 0 - 32 IU/L 39   21    Lipid Panel     Component Value Date/Time   CHOL 149 10/11/2023 1058   TRIG 124 10/11/2023 1058   HDL 33 (L) 10/11/2023 1058   CHOLHDL 4.5 (H) 10/11/2023 1058   LDLCALC 93 10/11/2023 1058   LABVLDL 23 10/11/2023 1058    Notable Signs/Symptoms: None  Lifestyle & Dietary Hx Married. Lives with spouse. 5 kids.  Estimated daily fluid intake: 60 oz Supplements:  Sleep: poor Stress / self-care: family dynamics Current average weekly  physical activity: walks   24-Hr Dietary Recall Eats 2-3 meals per day. Drink water and occasional soda and green tea when she goes out to eat with her grandmother. Eating more vegetables and fish lately.  Estimated Energy Needs Calories: 1200 Carbohydrate: 135g Protein: 90g Fat: 33g   NUTRITION DIAGNOSIS  NI-5.10.2 Excessive mineral intake (specify): Sodium As related to HTN.  As evidenced by on BP medications, 4 lbs weight gain and BP 147/97 mg/dl..   NUTRITION INTERVENTION  Nutrition education (E-1) on the following topics:  Lifestyle Medicine  - Whole Food, Plant Predominant Nutrition is highly recommended: Eat Plenty of vegetables, Mushrooms, fruits, Legumes, Whole Grains, Nuts, seeds in lieu of processed meats, processed snacks/pastries red meat, poultry, eggs.    -It is better to avoid simple carbohydrates including: Cakes, Sweet Desserts, Ice Cream, Soda (diet and regular), Sweet Tea, Candies, Chips, Cookies, Store Bought Juices, Alcohol in Excess of  1-2 drinks a day, Lemonade,  Artificial Sweeteners, Doughnuts, Coffee Creamers, "Sugar-free" Products, etc, etc.  This is not a complete list.....  Exercise: If you are able: 30 -60 minutes a day ,4 days a week, or 150 minutes a week.  The longer the better.  Combine stretch, strength, and aerobic activities.  If you were told in the past that you have high risk for cardiovascular diseases, you may seek evaluation by your heart doctor prior to initiating moderate to intense exercise programs.   Low salt diet  Handouts Provided Include  Lifestyle Medicine handouts Food Label-Low sodium  Learning Style & Readiness for Change Teaching method utilized: Visual & Auditory  Demonstrated degree of understanding via: Teach Back  Barriers to learning/adherence to lifestyle change: none  Goals Established by Pt Goals Get BP 130/80 mg/dl or less. Check BP at home daily. Increase vegetables 2 servings with lunch and dinner  daily Cut down on using salt and use Mrs. Dash and other herbs and spices to season with Walk every everning for 30 minutes  and add exercise Talk MD sleep study and referral to therapist if desired. Lose 1/2 -1 lb per week.   MONITORING & EVALUATION Dietary intake, weekly physical activity, and weight in 1 month.  Next Steps  Patient is to work on using herbs and spices and meal planning more whole plant based foods.Marland Kitchen

## 2023-11-28 ENCOUNTER — Encounter: Payer: Self-pay | Admitting: Nutrition

## 2023-12-20 DIAGNOSIS — Z8249 Family history of ischemic heart disease and other diseases of the circulatory system: Secondary | ICD-10-CM | POA: Diagnosis not present

## 2023-12-20 DIAGNOSIS — Z6841 Body Mass Index (BMI) 40.0 and over, adult: Secondary | ICD-10-CM | POA: Diagnosis not present

## 2023-12-20 DIAGNOSIS — R7303 Prediabetes: Secondary | ICD-10-CM | POA: Diagnosis not present

## 2023-12-20 DIAGNOSIS — I10 Essential (primary) hypertension: Secondary | ICD-10-CM | POA: Diagnosis not present

## 2023-12-27 ENCOUNTER — Ambulatory Visit: Admitting: Nutrition

## 2024-01-18 ENCOUNTER — Ambulatory Visit: Admitting: Nutrition

## 2024-01-22 ENCOUNTER — Ambulatory Visit: Payer: Medicaid Other | Admitting: Adult Health

## 2024-01-22 ENCOUNTER — Encounter: Payer: Self-pay | Admitting: Adult Health

## 2024-01-22 VITALS — BP 132/91 | HR 91 | Ht 63.0 in | Wt 247.0 lb

## 2024-01-22 DIAGNOSIS — I10 Essential (primary) hypertension: Secondary | ICD-10-CM

## 2024-01-22 DIAGNOSIS — R102 Pelvic and perineal pain unspecified side: Secondary | ICD-10-CM | POA: Insufficient documentation

## 2024-01-22 DIAGNOSIS — N920 Excessive and frequent menstruation with regular cycle: Secondary | ICD-10-CM

## 2024-01-22 DIAGNOSIS — K5900 Constipation, unspecified: Secondary | ICD-10-CM

## 2024-01-22 DIAGNOSIS — Z975 Presence of (intrauterine) contraceptive device: Secondary | ICD-10-CM

## 2024-01-22 NOTE — Progress Notes (Signed)
 Subjective:     Patient ID: Christine Woodard, female   DOB: [birth year], 33 y.o.   MRN: [id number]  HPI Christine Woodard is a 33 year old white female, married, G2P1102 in for follow up on BP, taking Microzide  12.5 mg 1 daily. She is complaining of pelvic pain, worse in last 2 weeks, and spotting on and off, and constipation, if strains sees blood from vagina.      Component Value Date/Time   DIAGPAP (A) 01/11/2023 0846    - Atypical squamous cells of undetermined significance (ASC-US )   DIAGPAP - Low grade squamous intraepithelial lesion (LSIL) (A) 03/07/2022 1329   DIAGPAP  11/05/2019 1413    - Negative for intraepithelial lesion or malignancy (NILM)   HPVHIGH Negative 01/11/2023 0846   HPVHIGH Positive (A) 03/07/2022 1329   HPVHIGH Positive (A) 11/05/2019 1413   ADEQPAP  01/11/2023 0846    Satisfactory for evaluation; transformation zone component ABSENT.   ADEQPAP  03/07/2022 1329    Satisfactory for evaluation; transformation zone component PRESENT.   ADEQPAP  11/05/2019 1413    Satisfactory for evaluation; transformation zone component PRESENT.    PCP is Dr Herman Longs  Review of Systems Has occasional headache +pelvic pain, worse in last 2 weeks,  +spotting on and off, denies new sex partner +constipation, if strains sees blood from vagina, she is taking phentermine now, and has some nausea  Reviewed past medical,surgical, social and family history. Reviewed medications and allergies.     Objective:   Physical Exam BP (!) 132/91 (BP Location: Right Arm, Patient Position: Sitting, Cuff Size: Large)   Pulse 91   Ht 5\' 3"  (1.6 m)   Wt 247 lb (112 kg)   BMI 43.75 kg/m     Skin warm and dry.  Lungs: clear to ausculation bilaterally. Cardiovascular: regular rate and rhythm.  Pelvic: external genitalia is normal in appearance no lesions, vagina:pink,urethra has no lesions or masses noted, cervix:smooth, +IUD strings at os, uterus: normal size, shape and contour, non tender, no masses  felt, adnexa: no masses, LLQ tenderness noted. Bladder is non tender and no masses felt.  Fallr isk is low  Upstream - 01/22/24 0939       Pregnancy Intention Screening   Does the patient want to become pregnant in the next year? No    Does the patient's partner want to become pregnant in the next year? No    Would the patient like to discuss contraceptive options today? No      Contraception Wrap Up   Current Method IUD or IUS    End Method IUD or IUS    Contraception Counseling Provided Yes            Examination chaperoned by Christine Aschoff LPN  Assessment:     1. Hypertension, unspecified type (Primary) Continue Microzide  12.5 mg 1 daily,has refills Follow up in 3 months for ROS and BP check  2. Pelvic pain Has more pelvic pain in last 2 weeks Will schedule pelvic US  to assess uterus and ovaries in office in about 2 weeks and will talk when results back   3. Spotting between menses Spotting on and off  Will get US  to assess uterus  4. IUD (intrauterine device) in place  +strings at os 5. Constipation, unspecified constipation type Has constipation at times, may see vaginal blood if strains Try fruit, like apple with peeling, grapes, prunes, white grape or apple juice     Plan:    Return in about 2  weeks for pelvic US  in office

## 2024-01-24 ENCOUNTER — Ambulatory Visit: Admitting: Nutrition

## 2024-02-06 ENCOUNTER — Ambulatory Visit (INDEPENDENT_AMBULATORY_CARE_PROVIDER_SITE_OTHER)

## 2024-02-06 DIAGNOSIS — Z975 Presence of (intrauterine) contraceptive device: Secondary | ICD-10-CM | POA: Diagnosis not present

## 2024-02-06 DIAGNOSIS — R102 Pelvic and perineal pain: Secondary | ICD-10-CM

## 2024-02-06 DIAGNOSIS — N920 Excessive and frequent menstruation with regular cycle: Secondary | ICD-10-CM

## 2024-02-06 NOTE — Progress Notes (Signed)
 PELVIC US  TA/TV: homogeneous anteverted uterus,WNL,IUD is centrally located within the endometrium,EEC 7 mm,normal ovaries,ovaries appear mobile,no free fluid,no pain during ultrasound  Chaperone Peggy

## 2024-02-09 ENCOUNTER — Telehealth: Payer: Self-pay | Admitting: Adult Health

## 2024-02-09 NOTE — Telephone Encounter (Signed)
 Pt aware that US  was normal, IUD in place.

## 2024-02-20 ENCOUNTER — Ambulatory Visit: Payer: Self-pay | Admitting: Adult Health

## 2024-03-20 DIAGNOSIS — R7303 Prediabetes: Secondary | ICD-10-CM | POA: Diagnosis not present

## 2024-03-20 DIAGNOSIS — I10 Essential (primary) hypertension: Secondary | ICD-10-CM | POA: Diagnosis not present

## 2024-03-20 DIAGNOSIS — Z1329 Encounter for screening for other suspected endocrine disorder: Secondary | ICD-10-CM | POA: Diagnosis not present

## 2024-03-20 DIAGNOSIS — Z13220 Encounter for screening for lipoid disorders: Secondary | ICD-10-CM | POA: Diagnosis not present

## 2024-03-20 DIAGNOSIS — D509 Iron deficiency anemia, unspecified: Secondary | ICD-10-CM | POA: Diagnosis not present

## 2024-03-20 DIAGNOSIS — D649 Anemia, unspecified: Secondary | ICD-10-CM | POA: Diagnosis not present

## 2024-03-27 DIAGNOSIS — Z8249 Family history of ischemic heart disease and other diseases of the circulatory system: Secondary | ICD-10-CM | POA: Diagnosis not present

## 2024-03-27 DIAGNOSIS — I10 Essential (primary) hypertension: Secondary | ICD-10-CM | POA: Diagnosis not present

## 2024-03-27 DIAGNOSIS — F411 Generalized anxiety disorder: Secondary | ICD-10-CM | POA: Diagnosis not present

## 2024-03-27 DIAGNOSIS — Z6841 Body Mass Index (BMI) 40.0 and over, adult: Secondary | ICD-10-CM | POA: Diagnosis not present

## 2024-04-23 ENCOUNTER — Encounter: Payer: Self-pay | Admitting: Adult Health

## 2024-04-23 ENCOUNTER — Ambulatory Visit: Admitting: Adult Health

## 2024-04-23 VITALS — BP 125/87 | HR 77 | Ht 63.0 in | Wt 234.0 lb

## 2024-04-23 DIAGNOSIS — I10 Essential (primary) hypertension: Secondary | ICD-10-CM | POA: Diagnosis not present

## 2024-04-23 MED ORDER — HYDROCHLOROTHIAZIDE 12.5 MG PO CAPS: 12.5000 mg | ORAL_CAPSULE | Freq: Every day | ORAL | 6 refills | Status: AC

## 2024-04-23 NOTE — Progress Notes (Signed)
  Subjective:     Patient ID: Christine Woodard, female   DOB: July 11, 1991, 33 y.o.   MRN: [id number]  HPI Christine Woodard is a 33 year old white female, married, G2P1102 back in follow up on BP. She is taking Microzide  12.5 mg 1 daily. She has been diagnosed with diabetes, not on meds yet, but has changed eating habits and has lost 13 lbs.     Component Value Date/Time   DIAGPAP (A) 01/11/2023 0846    - Atypical squamous cells of undetermined significance (ASC-US )   DIAGPAP - Low grade squamous intraepithelial lesion (LSIL) (A) 03/07/2022 1329   DIAGPAP  11/05/2019 1413    - Negative for intraepithelial lesion or malignancy (NILM)   HPVHIGH Negative 01/11/2023 0846   HPVHIGH Positive (A) 03/07/2022 1329   HPVHIGH Positive (A) 11/05/2019 1413   ADEQPAP  01/11/2023 0846    Satisfactory for evaluation; transformation zone component ABSENT.   ADEQPAP  03/07/2022 1329    Satisfactory for evaluation; transformation zone component PRESENT.   ADEQPAP  11/05/2019 1413    Satisfactory for evaluation; transformation zone component PRESENT.   PCP is Dr Lari.   Review of Systems Doing good, has lost 13 lbs since May No complaints  Reviewed past medical,surgical, social and family history. Reviewed medications and allergies.     Objective:   Physical Exam BP 125/87 (BP Location: Right Arm, Patient Position: Sitting, Cuff Size: Large)   Pulse 77   Ht 5' 3 (1.6 m)   Wt 234 lb (106.1 kg)   BMI 41.45 kg/m   has lost 13 lbs Skin warm and dry.  Lungs: clear to ausculation bilaterally. Cardiovascular: regular rate and rhythm.      Upstream - 04/23/24 0957       Pregnancy Intention Screening   Does the patient want to become pregnant in the next year? No    Does the patient's partner want to become pregnant in the next year? No    Would the patient like to discuss contraceptive options today? No      Contraception Wrap Up   Current Method IUD or IUS    End Method IUD or IUS    Contraception  Counseling Provided Yes          Assessment:     1. Hypertension, unspecified type (Primary) BP is good today, will continue microzide  12.5 mg 1 daily, refill sent Meds ordered this encounter  Medications   hydrochlorothiazide  (MICROZIDE ) 12.5 MG capsule    Sig: Take 1 capsule (12.5 mg total) by mouth daily.    Dispense:  30 capsule    Refill:  6    Supervising Provider:   JAYNE VONN DEL [[id number]]       Plan:     Follow up in 6 months or sooner if needed

## 2024-04-29 ENCOUNTER — Ambulatory Visit
Admission: EM | Admit: 2024-04-29 | Discharge: 2024-04-29 | Disposition: A | Discharge status: Home / Self Care | Attending: Family Medicine | Admitting: Family Medicine

## 2024-04-29 DIAGNOSIS — R1031 Right lower quadrant pain: Secondary | ICD-10-CM | POA: Diagnosis not present

## 2024-04-29 DIAGNOSIS — K5900 Constipation, unspecified: Secondary | ICD-10-CM | POA: Diagnosis not present

## 2024-04-29 LAB — POCT URINE DIPSTICK
Bilirubin, UA: NEGATIVE
Blood, UA: NEGATIVE
Glucose, UA: NEGATIVE mg/dL
Ketones, POC UA: NEGATIVE mg/dL
Nitrite, UA: NEGATIVE
Spec Grav, UA: 1.025 (ref 1.010–1.025)
Urobilinogen, UA: 0.2 U/dL
pH, UA: 5.5 (ref 5.0–8.0)

## 2024-04-29 NOTE — Discharge Instructions (Signed)
 Your workup is reassuring today, I have sent out for a urine culture as there was a touch of bacteria in your urine but I do not suspect a true urinary tract infection.  We will let you know if this comes back positive and needing treatment.  I am suspicious for constipation to be causing your intermittent lower abdominal pains.  Trial MiraLAX, high-fiber diet, increasing water  intake and over-the-counter pain relievers as needed.  Go to the emergency department if symptoms significantly worsen, primary care follow-up if unresolving

## 2024-04-29 NOTE — ED Triage Notes (Signed)
 Pt c/o RLQ abdominal pain x 3 days. That comes and goes. Not taking anything OTC for the pain.

## 2024-04-29 NOTE — ED Provider Notes (Signed)
 [facility]    CSN: [id number] Arrival date & time: 04/29/24  1213      History   Chief Complaint Chief Complaint  Patient presents with   Abdominal Pain    HPI Christine Woodard is a 33 y.o. female.   Patient presenting today with 3-day history of intermittent episodic right lower quadrant pain.  Denies any obvious aggravating or alleviating factors and denies associated fever, chills, vomiting, diarrhea, melena, hematemesis, urinary symptoms, new medication changes.  She does note some constipation, passed a bit of stool yesterday but did not feel relief after the small amount passed.  So far not try anything over-the-counter for symptoms.  Does note she was recently diagnosed with mild diabetes and has made some significant dietary changes.  IUD in place, no concerns for pregnancy.    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   Dysesthesia 05/13/2020   Other fatigue 05/13/2020   Vision disturbance 05/13/2020   Insomnia 05/13/2020   Encounter for IUD insertion 01/22/2020   Hemorrhoids 11/05/2019   Abnormal uterine bleeding (AUB) 11/05/2019   Weight gain 11/05/2019   Elevated BP without diagnosis of hypertension 11/05/2019    Past Surgical History:  Procedure Laterality Date   CERVICAL ABLATION N/A 07/05/2022   Procedure: CERVICAL ABLATION;  Surgeon: Ozan, Jennifer, DO;   Location: [facility];  Service: Gynecology;  Laterality: N/A;    OB History     Gravida  2   Para  2   Term  1   Preterm  1   AB  0   Living  2      SAB  0   IAB  0   Ectopic  0   Multiple  0   Live Births  2            Home Medications    Prior to Admission medications   Medication Sig Start Date End Date Taking? Authorizing Provider  hydrochlorothiazide  (MICROZIDE ) 12.5 MG capsule Take 1 capsule (12.5 mg total) by mouth daily. 04/23/24  Yes Signa Nest A, NP  rizatriptan (MAXALT) 10 MG tablet Take 10 mg by mouth 2 (two) times daily as needed. 04/02/24  Yes [provider]  cetirizine (ZYRTEC) 10 MG tablet Take 10 mg by mouth daily. 12/05/23   [provider]  ferrous sulfate 325 (65 FE) MG tablet Take 325 mg by mouth. Patient not taking: Reported on 04/23/2024    [provider]  levonorgestrel  (LILETTA , 52 MG,) 20.1 MCG/DAY IUD IUD 1 each by Intrauterine route once.    [provider]    Family History Family History  Problem Relation Age of Onset   Diabetes Father    Heart disease Father    Diabetes Paternal Grandmother    Hypertension Paternal Grandmother    Diabetes Paternal  Grandfather    Breast cancer Maternal Grandmother    Cancer Maternal Grandmother    Thyroid  disease Maternal Grandmother    Heart disease Maternal Grandfather    Hypotension Mother    Anemia Mother    Irritable bowel syndrome Mother    Seizures Sister    Hypertension Sister    Thyroid  disease Sister    Hypertension Sister    Diabetes Sister        prediabetic   Anemia Sister     Social History Social History   Tobacco Use   Smoking status: Never   Smokeless tobacco: Never  Vaping Use   Vaping status: Never Used  Substance Use Topics   Alcohol use: Yes    Comment: seldom   Drug use: No     Allergies   Amoxicillin  and Latex   Review of Systems Review of Systems Per HPI  Physical Exam Triage Vital Signs ED Triage  Vitals [04/29/24 1258]  Encounter Vitals Group     BP 126/84     Girls Systolic BP Percentile      Girls Diastolic BP Percentile      Boys Systolic BP Percentile      Boys Diastolic BP Percentile      Pulse Rate 86     Resp 16     Temp 98.7 F (37.1 C)     Temp Source Oral     SpO2 95 %     Weight      Height      Head Circumference      Peak Flow      Pain Score 8     Pain Loc      Pain Education      Exclude from Growth Chart    No data found.  Updated Vital Signs BP 126/84 (BP Location: Right Arm)   Pulse 86   Temp 98.7 F (37.1 C) (Oral)   Resp 16   LMP  (LMP Unknown)   SpO2 95%   Visual Acuity Right Eye Distance:   Left Eye Distance:   Bilateral Distance:    Right Eye Near:   Left Eye Near:    Bilateral Near:     Physical Exam Vitals and nursing note reviewed.  Constitutional:      Appearance: Normal appearance. She is not ill-appearing.  HENT:     Head: Atraumatic.     Mouth/Throat:     Mouth: Mucous membranes are moist.  Eyes:     Extraocular Movements: Extraocular movements intact.     Conjunctiva/sclera: Conjunctivae normal.  Cardiovascular:     Rate and Rhythm: Normal rate.  Pulmonary:     Effort: Pulmonary effort is normal. No respiratory distress.  Abdominal:     General: Bowel sounds are normal. There is no distension.     Palpations: Abdomen is soft. There is no mass.     Tenderness: There is abdominal tenderness. There is no right CVA tenderness, left CVA tenderness, guarding or rebound.     Hernia: No hernia is present.     Comments: Right lower quadrant tenderness to palpation without distention or guarding  Musculoskeletal:        General: Normal range of motion.     Cervical back: Normal range of motion and neck supple.  Skin:    General: Skin is warm and dry.  Neurological:     Mental Status: She is alert and oriented to person, place, and time.  Psychiatric:  Mood and Affect: Mood normal.        Thought Content:  Thought content normal.        Judgment: Judgment normal.      UC Treatments / Results  Labs (all labs ordered are listed, but only abnormal results are displayed) Labs Reviewed  POCT URINE DIPSTICK - Abnormal; Notable for the following components:      Result Value   Clarity, UA cloudy (*)    Leukocytes, UA Trace (*)    All other components within normal limits  URINE CULTURE    EKG   Radiology No results found.  Procedures Procedures (including critical care time)  Medications Ordered in UC Medications - No data to display  Initial Impression / Assessment and Plan / UC Course  I have reviewed the triage vital signs and the nursing notes.  Pertinent labs & imaging results that were available during my care of the patient were reviewed by me and considered in my medical decision making (see chart for details).     Vitals and exam overall reassuring with no red flag findings today, urinalysis with trace leuks, very low suspicion for UTI but urine culture pending for further evaluation.  Possibly related to some ongoing constipation.  Discussed bowel regimen with MiraLAX, high-fiber diet, increasing fluid intake and supportive over-the-counter medications and home care additionally.  ED for worsening symptoms, otherwise PCP follow-up as soon as possible for recheck.  Final Clinical Impressions(s) / UC Diagnoses   Final diagnoses:  RLQ abdominal pain  Constipation, unspecified constipation type     Discharge Instructions      Your workup is reassuring today, I have sent out for a urine culture as there was a touch of bacteria in your urine but I do not suspect a true urinary tract infection.  We will let you know if this comes back positive and needing treatment.  I am suspicious for constipation to be causing your intermittent lower abdominal pains.  Trial MiraLAX, high-fiber diet, increasing water  intake and over-the-counter pain relievers as needed.  Go to the  emergency department if symptoms significantly worsen, primary care follow-up if unresolving    ED Prescriptions   None    PDMP not reviewed this encounter.   Stuart Vernell Norris, [place] 04/29/24 1524

## 2024-04-30 LAB — URINE CULTURE

## 2024-05-01 ENCOUNTER — Ambulatory Visit: Payer: Self-pay

## 2024-05-21 DIAGNOSIS — I10 Essential (primary) hypertension: Secondary | ICD-10-CM | POA: Diagnosis not present

## 2024-05-21 DIAGNOSIS — E1169 Type 2 diabetes mellitus with other specified complication: Secondary | ICD-10-CM | POA: Diagnosis not present

## 2024-05-30 DIAGNOSIS — M545 Low back pain, unspecified: Secondary | ICD-10-CM | POA: Diagnosis not present

## 2024-05-30 DIAGNOSIS — Z6841 Body Mass Index (BMI) 40.0 and over, adult: Secondary | ICD-10-CM | POA: Diagnosis not present

## 2024-05-30 DIAGNOSIS — R110 Nausea: Secondary | ICD-10-CM | POA: Diagnosis not present

## 2024-05-30 DIAGNOSIS — R1031 Right lower quadrant pain: Secondary | ICD-10-CM | POA: Diagnosis not present

## 2024-06-04 ENCOUNTER — Other Ambulatory Visit: Payer: Self-pay

## 2024-06-04 DIAGNOSIS — F411 Generalized anxiety disorder: Secondary | ICD-10-CM | POA: Diagnosis not present

## 2024-06-04 DIAGNOSIS — R1031 Right lower quadrant pain: Secondary | ICD-10-CM

## 2024-06-04 DIAGNOSIS — I10 Essential (primary) hypertension: Secondary | ICD-10-CM | POA: Diagnosis not present

## 2024-06-04 DIAGNOSIS — E1169 Type 2 diabetes mellitus with other specified complication: Secondary | ICD-10-CM | POA: Diagnosis not present

## 2024-06-04 DIAGNOSIS — Z6841 Body Mass Index (BMI) 40.0 and over, adult: Secondary | ICD-10-CM | POA: Diagnosis not present

## 2024-06-11 ENCOUNTER — Inpatient Hospital Stay: Admission: RE | Admit: 2024-06-11 | Discharge: 2024-06-11

## 2024-06-11 DIAGNOSIS — R1031 Right lower quadrant pain: Secondary | ICD-10-CM

## 2024-06-11 DIAGNOSIS — K760 Fatty (change of) liver, not elsewhere classified: Secondary | ICD-10-CM | POA: Diagnosis not present

## 2024-08-10 DIAGNOSIS — Z112 Encounter for screening for other bacterial diseases: Secondary | ICD-10-CM | POA: Diagnosis not present

## 2024-08-10 DIAGNOSIS — Z20828 Contact with and (suspected) exposure to other viral communicable diseases: Secondary | ICD-10-CM | POA: Diagnosis not present

## 2024-08-10 DIAGNOSIS — Z6841 Body Mass Index (BMI) 40.0 and over, adult: Secondary | ICD-10-CM | POA: Diagnosis not present

## 2024-08-10 DIAGNOSIS — J069 Acute upper respiratory infection, unspecified: Secondary | ICD-10-CM | POA: Diagnosis not present

## 2024-08-10 DIAGNOSIS — J029 Acute pharyngitis, unspecified: Secondary | ICD-10-CM | POA: Diagnosis not present

## 2024-09-19 ENCOUNTER — Encounter: Payer: Self-pay | Admitting: Internal Medicine
